# Patient Record
Sex: Female | Born: 1960 | Race: White | Hispanic: No | Marital: Married | Smoking: Former smoker
Health system: Southern US, Community
[De-identification: ages and names within clinical notes are randomized; demographics above are authoritative.]

## PROBLEM LIST (undated history)

## (undated) DIAGNOSIS — M1990 Unspecified osteoarthritis, unspecified site: Secondary | ICD-10-CM

## (undated) DIAGNOSIS — N809 Endometriosis, unspecified: Secondary | ICD-10-CM

## (undated) DIAGNOSIS — T7840XA Allergy, unspecified, initial encounter: Secondary | ICD-10-CM

## (undated) DIAGNOSIS — G43909 Migraine, unspecified, not intractable, without status migrainosus: Secondary | ICD-10-CM

## (undated) HISTORY — DX: Unspecified osteoarthritis, unspecified site: M19.90

## (undated) HISTORY — DX: Allergy, unspecified, initial encounter: T78.40XA

## (undated) HISTORY — PX: SHOULDER SURGERY: SHX246

## (undated) HISTORY — PX: ENDOMETRIAL ABLATION: SHX621

## (undated) HISTORY — DX: Migraine, unspecified, not intractable, without status migrainosus: G43.909

---

## 2019-08-01 DIAGNOSIS — Z01419 Encounter for gynecological examination (general) (routine) without abnormal findings: Secondary | ICD-10-CM | POA: Diagnosis not present

## 2019-09-01 DIAGNOSIS — R509 Fever, unspecified: Secondary | ICD-10-CM | POA: Diagnosis not present

## 2019-09-01 DIAGNOSIS — H938X9 Other specified disorders of ear, unspecified ear: Secondary | ICD-10-CM | POA: Diagnosis not present

## 2019-09-01 DIAGNOSIS — R0981 Nasal congestion: Secondary | ICD-10-CM | POA: Diagnosis not present

## 2019-12-31 DIAGNOSIS — R7303 Prediabetes: Secondary | ICD-10-CM | POA: Diagnosis not present

## 2019-12-31 DIAGNOSIS — R21 Rash and other nonspecific skin eruption: Secondary | ICD-10-CM | POA: Diagnosis not present

## 2019-12-31 DIAGNOSIS — E7800 Pure hypercholesterolemia, unspecified: Secondary | ICD-10-CM | POA: Diagnosis not present

## 2020-01-30 DIAGNOSIS — R519 Headache, unspecified: Secondary | ICD-10-CM | POA: Diagnosis not present

## 2020-01-30 DIAGNOSIS — S0081XA Abrasion of other part of head, initial encounter: Secondary | ICD-10-CM | POA: Diagnosis not present

## 2020-01-30 DIAGNOSIS — W009XXA Unspecified fall due to ice and snow, initial encounter: Secondary | ICD-10-CM | POA: Diagnosis not present

## 2020-01-30 DIAGNOSIS — S0083XA Contusion of other part of head, initial encounter: Secondary | ICD-10-CM | POA: Diagnosis not present

## 2020-01-31 ENCOUNTER — Encounter (HOSPITAL_COMMUNITY): Payer: Self-pay | Admitting: Emergency Medicine

## 2020-01-31 ENCOUNTER — Other Ambulatory Visit: Payer: Self-pay

## 2020-01-31 ENCOUNTER — Emergency Department (HOSPITAL_COMMUNITY)
Admission: EM | Admit: 2020-01-31 | Discharge: 2020-02-01 | Disposition: A | Discharge status: Home / Self Care | Payer: BC Managed Care – PPO | Attending: Emergency Medicine | Admitting: Emergency Medicine

## 2020-01-31 ENCOUNTER — Emergency Department (HOSPITAL_COMMUNITY): Payer: BC Managed Care – PPO

## 2020-01-31 DIAGNOSIS — R079 Chest pain, unspecified: Secondary | ICD-10-CM | POA: Diagnosis not present

## 2020-01-31 DIAGNOSIS — R0789 Other chest pain: Secondary | ICD-10-CM | POA: Diagnosis not present

## 2020-01-31 DIAGNOSIS — Z79899 Other long term (current) drug therapy: Secondary | ICD-10-CM | POA: Diagnosis not present

## 2020-01-31 HISTORY — DX: Endometriosis, unspecified: N80.9

## 2020-01-31 LAB — CBC
HCT: 42.2 % (ref 36.0–46.0)
Hemoglobin: 13.6 g/dL (ref 12.0–15.0)
MCH: 30.6 pg (ref 26.0–34.0)
MCHC: 32.2 g/dL (ref 30.0–36.0)
MCV: 94.8 fL (ref 80.0–100.0)
Platelets: 276 10*3/uL (ref 150–400)
RBC: 4.45 MIL/uL (ref 3.87–5.11)
RDW: 13.2 % (ref 11.5–15.5)
WBC: 9.6 10*3/uL (ref 4.0–10.5)
nRBC: 0 % (ref 0.0–0.2)

## 2020-01-31 LAB — BASIC METABOLIC PANEL
Anion gap: 10 (ref 5–15)
BUN: 17 mg/dL (ref 8–23)
CO2: 25 mmol/L (ref 22–32)
Calcium: 9.8 mg/dL (ref 8.9–10.3)
Chloride: 102 mmol/L (ref 98–111)
Creatinine, Ser: 0.79 mg/dL (ref 0.44–1.00)
GFR, Estimated: >60 mL/min (ref >60)
Glucose, Bld: 130 mg/dL — ABNORMAL HIGH (ref 70–99)
Potassium: 4.1 mmol/L (ref 3.5–5.1)
Sodium: 137 mmol/L (ref 135–145)

## 2020-01-31 LAB — TROPONIN I (HIGH SENSITIVITY): Troponin I (High Sensitivity): 4 ng/L (ref <18)

## 2020-01-31 NOTE — ED Triage Notes (Signed)
Patient reports left chest pain "pressure/heat" radiating to both arms and body this evening  , denies SOB , no emesis or diaphoresis .

## 2020-01-31 NOTE — ED Provider Notes (Signed)
[facility] Provider Note   CSN: [id number] Arrival date & time: 01/31/20  2133     History Chief Complaint  Patient presents with  . Chest Pain    [facility] is a 60 y.o. female.  Presents to the emergency department for evaluation of chest pain.  Patient reports that she had onset of a "pinging" sensation in the center of her chest earlier today.  The area skips around from left to right.  She has not identified anything that causes the symptom.  She does not have any nausea, diaphoresis or shortness of breath.  No history of heart disease.  She reports that she has felt some "wooziness", no syncope.  After she had onset of the discomfort in the chest she started to feel a "hot" sensation that traveled down her body.  This sensation has since resolved.  Patient reports that she has borderline high cholesterol, no hypertension, no diabetes, no smoking history.  Mother had congestive heart failure and heart attack later in life but no early heart disease.  Patient reports that she fell a couple of days ago shoveling her driveway.  She is not sure if this is related to the chest discomfort, cannot recall if she had the symptoms prior to the fall or not.     HPI: A 60 year old patient with a history of hypercholesterolemia and obesity presents for evaluation of chest pain. Initial onset of pain was more than 6 hours ago. The patient's chest pain is well-localized and is not worse with exertion. The patient's chest pain is middle- or left-sided, is not described as heaviness/pressure/tightness, is not sharp and does not radiate to the arms/jaw/neck. The patient does not complain of nausea and denies diaphoresis. The patient has no history of stroke, has no history of peripheral artery disease, has not smoked in the past 90 days, denies any history of treated diabetes, has no relevant family history of coronary artery disease (first degree relative at  less than age 37) and is not hypertensive.   Past Medical History:  Diagnosis Date  . Endometriosis     There are no problems to display for this patient.   History reviewed. No pertinent surgical history.   OB History   No obstetric history on file.     No family history on file.  Social History   Tobacco Use  . Smoking status: Never Smoker  . Smokeless tobacco: Never Used  Substance Use Topics  . Alcohol use: Never  . Drug use: Never    Home Medications Prior to Admission medications   Medication Sig Start Date End Date Taking? Authorizing Provider  acetaminophen (TYLENOL) 325 MG tablet Take 650 mg by mouth every 6 (six) hours as needed for moderate pain or headache.   Yes [provider]  Alpha-D-Galactosidase (ECK FOOD ENZYME PO) Take 1 capsule by mouth See admin instructions. 30 minutes before supper   Yes [provider]  atorvastatin (LIPITOR) 40 MG tablet Take 40 mg by mouth at bedtime. 01/30/20  Yes [provider]  EPINEPHrine 0.3 mg/0.3 mL IJ SOAJ injection Inject 0.3 mg into the muscle daily as needed.   Yes [provider]  estradiol (ESTRACE) 1 MG tablet Take 1 mg by mouth at bedtime. 11/29/19  Yes [provider]  fexofenadine (ALLEGRA) 60 MG tablet Take 60 mg by mouth daily. 03/21/18  Yes [provider]  glucosamine-chondroitin 500-400 MG tablet Take 1 tablet by mouth at bedtime.  Yes [provider]  Lactobacillus-Inulin (CULTURELLE DIGESTIVE DAILY PO) Take 1 capsule by mouth daily.   Yes [provider]  LUTEIN PO Take 1 tablet by mouth at bedtime.   Yes [provider]  Magnesium 200 MG TABS Take 400 mg by mouth at bedtime.   Yes [provider]  MEGARED OMEGA-3 KRILL OIL PO Take 1 capsule by mouth at bedtime.   Yes [provider]  Multiple Vitamins-Minerals (CENTRUM SILVER ADULT 50+ PO) Take 1 tablet by mouth daily.   Yes [provider]   progesterone (PROMETRIUM) 100 MG capsule Take 100 mg by mouth at bedtime. 11/29/19  Yes [provider]  TURMERIC PO Take 1 tablet by mouth at bedtime.   Yes [provider]    Allergies    Bee venom and Other  Review of Systems   Review of Systems  Cardiovascular: Positive for chest pain.  Neurological: Positive for dizziness.  All other systems reviewed and are negative.   Physical Exam Updated Vital Signs BP (!) 141/62   Pulse 68   Temp 98 F (36.7 C) (Oral)   Resp 19   Ht 5' (1.524 m)   Wt 78 kg   SpO2 100%   BMI 33.58 kg/m   Physical Exam Vitals and nursing note reviewed.  Constitutional:      General: She is not in acute distress.    Appearance: Normal appearance. She is well-developed and well-nourished.  HENT:     Head: Normocephalic and atraumatic.     Right Ear: Hearing normal.     Left Ear: Hearing normal.     Nose: Nose normal.     Mouth/Throat:     Mouth: Oropharynx is clear and moist and mucous membranes are normal.  Eyes:     Extraocular Movements: EOM normal.     Conjunctiva/sclera: Conjunctivae normal.     Pupils: Pupils are equal, round, and reactive to light.  Cardiovascular:     Rate and Rhythm: Regular rhythm.     Heart sounds: S1 normal and S2 normal. No murmur heard. No friction rub. No gallop.   Pulmonary:     Effort: Pulmonary effort is normal. No respiratory distress.     Breath sounds: Normal breath sounds.  Chest:     Chest wall: No tenderness.  Abdominal:     General: Bowel sounds are normal.     Palpations: Abdomen is soft. There is no hepatosplenomegaly.     Tenderness: There is no abdominal tenderness. There is no guarding or rebound. Negative signs include Murphy's sign and McBurney's sign.     Hernia: No hernia is present.  Musculoskeletal:        General: Normal range of motion.     Cervical back: Normal range of motion and neck supple.  Skin:    General: Skin is warm, dry and intact.     Findings: No  rash.     Nails: There is no cyanosis.  Neurological:     Mental Status: She is alert and oriented to person, place, and time.     GCS: GCS eye subscore is 4. GCS verbal subscore is 5. GCS motor subscore is 6.     Cranial Nerves: No cranial nerve deficit.     Sensory: No sensory deficit.     Coordination: Coordination normal.     Deep Tendon Reflexes: Strength normal.  Psychiatric:        Mood and Affect: Mood and affect normal.  Speech: Speech normal.        Behavior: Behavior normal.        Thought Content: Thought content normal.     ED Results / Procedures / Treatments   Labs (all labs ordered are listed, but only abnormal results are displayed) Labs Reviewed  BASIC METABOLIC PANEL - Abnormal; Notable for the following components:      Result Value   Glucose, Bld 130 (*)    All other components within normal limits  CBC  TROPONIN I (HIGH SENSITIVITY)  TROPONIN I (HIGH SENSITIVITY)    EKG EKG Interpretation  Date/Time:  Saturday January 31 2020 21:51:00 EST Ventricular Rate:  70 PR Interval:  134 QRS Duration: 86 QT Interval:  392 QTC Calculation: 423 R Axis:   30 Text Interpretation: Sinus rhythm with sinus arrhythmia with frequent Premature ventricular complexes Cannot rule out Anterior infarct , age undetermined Abnormal ECG No old tracing to compare Confirmed by Marily Memos [phone]) on 01/31/2020 11:12:58 PM   Radiology DG Chest 2 View  Result Date: 01/31/2020 CLINICAL DATA:  Chest pain EXAM: CHEST - 2 VIEW COMPARISON:  None. FINDINGS: The heart size and mediastinal contours are within normal limits. Both lungs are clear. The visualized skeletal structures are unremarkable. IMPRESSION: No active cardiopulmonary disease. Electronically Signed   By: Burman Nieves M.D.   On: 01/31/2020 22:29    Procedures Procedures (including critical care time)  Medications Ordered in ED Medications - No data to display  ED Course  I have reviewed the triage  vital signs and the nursing notes.  Pertinent labs & imaging results that were available during my care of the patient were reviewed by me and considered in my medical decision making (see chart for details).    MDM Rules/Calculators/A&P HEAR Score: 2                        Patient presents to the emergency department for chest discomfort.  Symptoms seem somewhat atypical.  She is experiencing intermittent paroxysms of pain in the center of her chest throughout the day.  Pain is sometimes to the right of the sternal border, sometimes to the left.  She has not identified any causal factors.  No associated shortness of breath, nausea, diaphoresis.  She has minimal cardiac risk factors, hear score is 2.  Work-up has been reassuring.  EKG with PVCs, no other acute findings.  Troponins negative.  No tachycardia, tachypnea, hypoxia or shortness of breath.  She did have a fall the other day and this might be related in someway, however she does not have any significant chest wall tenderness.  As patient is very low risk, heart pathway recommends outpatient follow-up.  She appears stable for discharge at this time.  She does not have a PCP, referred to cardiology for repeat evaluation.  Given return precautions.  Final Clinical Impression(s) / ED Diagnoses Final diagnoses:  Atypical chest pain    Rx / DC Orders ED Discharge Orders    None       Aaron Bostwick, Canary Brim, MD 02/01/20 [phone]

## 2020-02-01 LAB — TROPONIN I (HIGH SENSITIVITY): Troponin I (High Sensitivity): 4 ng/L (ref <18)

## 2020-06-09 DIAGNOSIS — S70369A Insect bite (nonvenomous), unspecified thigh, initial encounter: Secondary | ICD-10-CM | POA: Diagnosis not present

## 2020-06-09 DIAGNOSIS — W57XXXA Bitten or stung by nonvenomous insect and other nonvenomous arthropods, initial encounter: Secondary | ICD-10-CM | POA: Diagnosis not present

## 2020-06-24 DIAGNOSIS — J329 Chronic sinusitis, unspecified: Secondary | ICD-10-CM | POA: Diagnosis not present

## 2020-07-15 DIAGNOSIS — H2513 Age-related nuclear cataract, bilateral: Secondary | ICD-10-CM | POA: Diagnosis not present

## 2020-07-15 DIAGNOSIS — H353131 Nonexudative age-related macular degeneration, bilateral, early dry stage: Secondary | ICD-10-CM | POA: Diagnosis not present

## 2020-07-15 DIAGNOSIS — H5213 Myopia, bilateral: Secondary | ICD-10-CM | POA: Diagnosis not present

## 2020-10-12 DIAGNOSIS — Z8249 Family history of ischemic heart disease and other diseases of the circulatory system: Secondary | ICD-10-CM | POA: Diagnosis not present

## 2020-10-12 DIAGNOSIS — M25561 Pain in right knee: Secondary | ICD-10-CM | POA: Diagnosis not present

## 2020-10-12 DIAGNOSIS — Z23 Encounter for immunization: Secondary | ICD-10-CM | POA: Diagnosis not present

## 2020-10-12 DIAGNOSIS — E7800 Pure hypercholesterolemia, unspecified: Secondary | ICD-10-CM | POA: Diagnosis not present

## 2020-10-12 DIAGNOSIS — R7303 Prediabetes: Secondary | ICD-10-CM | POA: Diagnosis not present

## 2020-10-22 DIAGNOSIS — M25561 Pain in right knee: Secondary | ICD-10-CM | POA: Diagnosis not present

## 2020-10-22 DIAGNOSIS — H25043 Posterior subcapsular polar age-related cataract, bilateral: Secondary | ICD-10-CM | POA: Diagnosis not present

## 2020-10-22 DIAGNOSIS — G8929 Other chronic pain: Secondary | ICD-10-CM | POA: Diagnosis not present

## 2020-10-22 DIAGNOSIS — H2513 Age-related nuclear cataract, bilateral: Secondary | ICD-10-CM | POA: Diagnosis not present

## 2020-10-26 DIAGNOSIS — M25561 Pain in right knee: Secondary | ICD-10-CM | POA: Diagnosis not present

## 2020-10-26 DIAGNOSIS — G8929 Other chronic pain: Secondary | ICD-10-CM | POA: Diagnosis not present

## 2020-11-02 DIAGNOSIS — G8929 Other chronic pain: Secondary | ICD-10-CM | POA: Diagnosis not present

## 2020-11-02 DIAGNOSIS — M25561 Pain in right knee: Secondary | ICD-10-CM | POA: Diagnosis not present

## 2020-11-16 DIAGNOSIS — G8929 Other chronic pain: Secondary | ICD-10-CM | POA: Diagnosis not present

## 2020-11-16 DIAGNOSIS — M25561 Pain in right knee: Secondary | ICD-10-CM | POA: Diagnosis not present

## 2020-11-18 DIAGNOSIS — Z780 Asymptomatic menopausal state: Secondary | ICD-10-CM | POA: Diagnosis not present

## 2020-11-18 DIAGNOSIS — Z01419 Encounter for gynecological examination (general) (routine) without abnormal findings: Secondary | ICD-10-CM | POA: Diagnosis not present

## 2020-11-23 DIAGNOSIS — G8929 Other chronic pain: Secondary | ICD-10-CM | POA: Diagnosis not present

## 2020-11-23 DIAGNOSIS — M25561 Pain in right knee: Secondary | ICD-10-CM | POA: Diagnosis not present

## 2020-11-24 DIAGNOSIS — Z1231 Encounter for screening mammogram for malignant neoplasm of breast: Secondary | ICD-10-CM | POA: Diagnosis not present

## 2020-12-07 DIAGNOSIS — G8929 Other chronic pain: Secondary | ICD-10-CM | POA: Diagnosis not present

## 2020-12-07 DIAGNOSIS — M25561 Pain in right knee: Secondary | ICD-10-CM | POA: Diagnosis not present

## 2020-12-14 DIAGNOSIS — G8929 Other chronic pain: Secondary | ICD-10-CM | POA: Diagnosis not present

## 2020-12-14 DIAGNOSIS — M25561 Pain in right knee: Secondary | ICD-10-CM | POA: Diagnosis not present

## 2020-12-21 DIAGNOSIS — M25561 Pain in right knee: Secondary | ICD-10-CM | POA: Diagnosis not present

## 2020-12-21 DIAGNOSIS — G8929 Other chronic pain: Secondary | ICD-10-CM | POA: Diagnosis not present

## 2020-12-31 DIAGNOSIS — G8929 Other chronic pain: Secondary | ICD-10-CM | POA: Diagnosis not present

## 2020-12-31 DIAGNOSIS — M25561 Pain in right knee: Secondary | ICD-10-CM | POA: Diagnosis not present

## 2021-01-14 DIAGNOSIS — M25561 Pain in right knee: Secondary | ICD-10-CM | POA: Diagnosis not present

## 2021-01-14 DIAGNOSIS — G8929 Other chronic pain: Secondary | ICD-10-CM | POA: Diagnosis not present

## 2021-01-18 DIAGNOSIS — H25812 Combined forms of age-related cataract, left eye: Secondary | ICD-10-CM | POA: Diagnosis not present

## 2021-01-18 DIAGNOSIS — H2512 Age-related nuclear cataract, left eye: Secondary | ICD-10-CM | POA: Diagnosis not present

## 2021-01-18 DIAGNOSIS — H25042 Posterior subcapsular polar age-related cataract, left eye: Secondary | ICD-10-CM | POA: Diagnosis not present

## 2021-02-01 DIAGNOSIS — H25011 Cortical age-related cataract, right eye: Secondary | ICD-10-CM | POA: Diagnosis not present

## 2021-02-01 DIAGNOSIS — H25811 Combined forms of age-related cataract, right eye: Secondary | ICD-10-CM | POA: Diagnosis not present

## 2021-02-01 DIAGNOSIS — H2511 Age-related nuclear cataract, right eye: Secondary | ICD-10-CM | POA: Diagnosis not present

## 2021-02-11 DIAGNOSIS — G8929 Other chronic pain: Secondary | ICD-10-CM | POA: Diagnosis not present

## 2021-02-11 DIAGNOSIS — M25561 Pain in right knee: Secondary | ICD-10-CM | POA: Diagnosis not present

## 2021-02-15 DIAGNOSIS — M25561 Pain in right knee: Secondary | ICD-10-CM | POA: Diagnosis not present

## 2021-02-15 DIAGNOSIS — G8929 Other chronic pain: Secondary | ICD-10-CM | POA: Diagnosis not present

## 2021-02-22 DIAGNOSIS — M25561 Pain in right knee: Secondary | ICD-10-CM | POA: Diagnosis not present

## 2021-02-22 DIAGNOSIS — G8929 Other chronic pain: Secondary | ICD-10-CM | POA: Diagnosis not present

## 2021-03-09 DIAGNOSIS — M25561 Pain in right knee: Secondary | ICD-10-CM | POA: Diagnosis not present

## 2021-03-09 DIAGNOSIS — G8929 Other chronic pain: Secondary | ICD-10-CM | POA: Diagnosis not present

## 2021-03-15 DIAGNOSIS — M25561 Pain in right knee: Secondary | ICD-10-CM | POA: Diagnosis not present

## 2021-03-15 DIAGNOSIS — G8929 Other chronic pain: Secondary | ICD-10-CM | POA: Diagnosis not present

## 2021-04-01 DIAGNOSIS — M25561 Pain in right knee: Secondary | ICD-10-CM | POA: Diagnosis not present

## 2021-04-01 DIAGNOSIS — G8929 Other chronic pain: Secondary | ICD-10-CM | POA: Diagnosis not present

## 2021-04-05 DIAGNOSIS — G8929 Other chronic pain: Secondary | ICD-10-CM | POA: Diagnosis not present

## 2021-04-05 DIAGNOSIS — M25561 Pain in right knee: Secondary | ICD-10-CM | POA: Diagnosis not present

## 2021-04-08 DIAGNOSIS — M25561 Pain in right knee: Secondary | ICD-10-CM | POA: Diagnosis not present

## 2021-04-08 DIAGNOSIS — G8929 Other chronic pain: Secondary | ICD-10-CM | POA: Diagnosis not present

## 2021-04-12 DIAGNOSIS — G8929 Other chronic pain: Secondary | ICD-10-CM | POA: Diagnosis not present

## 2021-04-12 DIAGNOSIS — M25561 Pain in right knee: Secondary | ICD-10-CM | POA: Diagnosis not present

## 2021-04-19 DIAGNOSIS — G8929 Other chronic pain: Secondary | ICD-10-CM | POA: Diagnosis not present

## 2021-04-19 DIAGNOSIS — M25561 Pain in right knee: Secondary | ICD-10-CM | POA: Diagnosis not present

## 2021-04-26 DIAGNOSIS — G8929 Other chronic pain: Secondary | ICD-10-CM | POA: Diagnosis not present

## 2021-04-26 DIAGNOSIS — M25561 Pain in right knee: Secondary | ICD-10-CM | POA: Diagnosis not present

## 2021-05-03 DIAGNOSIS — G8929 Other chronic pain: Secondary | ICD-10-CM | POA: Diagnosis not present

## 2021-05-03 DIAGNOSIS — M25561 Pain in right knee: Secondary | ICD-10-CM | POA: Diagnosis not present

## 2021-05-13 ENCOUNTER — Ambulatory Visit: Payer: BC Managed Care – PPO | Admitting: Nurse Practitioner

## 2021-05-13 ENCOUNTER — Encounter: Payer: Self-pay | Admitting: Nurse Practitioner

## 2021-05-13 VITALS — BP 124/62 | HR 66 | Temp 97.3°F | Ht 61.0 in | Wt 173.0 lb

## 2021-05-13 DIAGNOSIS — Z131 Encounter for screening for diabetes mellitus: Secondary | ICD-10-CM

## 2021-05-13 DIAGNOSIS — Z136 Encounter for screening for cardiovascular disorders: Secondary | ICD-10-CM | POA: Diagnosis not present

## 2021-05-13 DIAGNOSIS — I10 Essential (primary) hypertension: Secondary | ICD-10-CM | POA: Diagnosis not present

## 2021-05-13 DIAGNOSIS — G8929 Other chronic pain: Secondary | ICD-10-CM

## 2021-05-13 DIAGNOSIS — R7303 Prediabetes: Secondary | ICD-10-CM

## 2021-05-13 DIAGNOSIS — Z79899 Other long term (current) drug therapy: Secondary | ICD-10-CM | POA: Diagnosis not present

## 2021-05-13 DIAGNOSIS — D6851 Activated protein C resistance: Secondary | ICD-10-CM

## 2021-05-13 DIAGNOSIS — E669 Obesity, unspecified: Secondary | ICD-10-CM

## 2021-05-13 DIAGNOSIS — Z0000 Encounter for general adult medical examination without abnormal findings: Secondary | ICD-10-CM

## 2021-05-13 DIAGNOSIS — Z13220 Encounter for screening for lipoid disorders: Secondary | ICD-10-CM | POA: Diagnosis not present

## 2021-05-13 DIAGNOSIS — I209 Angina pectoris, unspecified: Secondary | ICD-10-CM

## 2021-05-13 DIAGNOSIS — J342 Deviated nasal septum: Secondary | ICD-10-CM

## 2021-05-13 DIAGNOSIS — J302 Other seasonal allergic rhinitis: Secondary | ICD-10-CM

## 2021-05-13 DIAGNOSIS — E559 Vitamin D deficiency, unspecified: Secondary | ICD-10-CM | POA: Diagnosis not present

## 2021-05-13 DIAGNOSIS — R42 Dizziness and giddiness: Secondary | ICD-10-CM

## 2021-05-13 DIAGNOSIS — R110 Nausea: Secondary | ICD-10-CM

## 2021-05-13 DIAGNOSIS — Z1389 Encounter for screening for other disorder: Secondary | ICD-10-CM | POA: Diagnosis not present

## 2021-05-13 DIAGNOSIS — Z0001 Encounter for general adult medical examination with abnormal findings: Secondary | ICD-10-CM

## 2021-05-13 DIAGNOSIS — Z1321 Encounter for screening for nutritional disorder: Secondary | ICD-10-CM

## 2021-05-13 DIAGNOSIS — R7309 Other abnormal glucose: Secondary | ICD-10-CM

## 2021-05-13 DIAGNOSIS — E7800 Pure hypercholesterolemia, unspecified: Secondary | ICD-10-CM
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
## 2021-05-13 NOTE — Patient Instructions (Signed)
Acute Knee Pain, Adult Many things can cause knee pain. Sometimes, knee pain is sudden (acute) and may be caused by damage, swelling, or irritation of the muscles and tissues that support your knee. The pain often goes away on its own with time and rest. If the pain does not go away, tests may be done to find out what is causing the pain. Follow these instructions at home: If you have a knee sleeve or brace:  Wear the knee sleeve or brace as told by your doctor. Take it off only as told by your doctor. Loosen it if your toes: Tingle. Become numb. Turn cold and blue. Keep it clean. If the knee sleeve or brace is not waterproof: Do not let it get wet. Cover it with a watertight covering when you take a bath or shower. Activity Rest your knee. Do not do things that cause pain or make pain worse. Avoid activities where both feet leave the ground at the same time (high-impact activities). Examples are running, jumping rope, and doing jumping jacks. Work with a physical therapist to make a safe exercise program, as told by your doctor. Managing pain, stiffness, and swelling  If told, put ice on the knee. To do this: If you have a removable knee sleeve or brace, take it off as told by your doctor. Put ice in a plastic bag. Place a towel between your skin and the bag. Leave the ice on for 20 minutes, 2-3 times a day. Take off the ice if your skin turns bright red. This is very important. If you cannot feel pain, heat, or cold, you have a greater risk of damage to the area. If told, use an elastic bandage to put pressure (compression) on your injured knee. Raise your knee above the level of your heart while you are sitting or lying down. Sleep with a pillow under your knee. General instructions Take over-the-counter and prescription medicines only as told by your doctor. Do not smoke or use any products that contain nicotine or tobacco. If you need help quitting, ask your doctor. If you are  overweight, work with your doctor and a food expert (dietitian) to set goals to lose weight. Being overweight can make your knee hurt more. Watch for any changes in your symptoms. Keep all follow-up visits. Contact a doctor if: The knee pain does not stop. The knee pain changes or gets worse. You have a fever along with knee pain. Your knee is red or feels warm when you touch it. Your knee gives out or locks up. Get help right away if: Your knee swells, and the swelling gets worse. You cannot move your knee. You have very bad knee pain that does not get better with pain medicine. Summary Many things can cause knee pain. The pain often goes away on its own with time and rest. Your doctor may do tests to find out the cause of the pain. Watch for any changes in your symptoms. Relieve your pain with rest, medicines, light activity, and use of ice. Get help right away if you cannot move your knee or your knee pain is very bad. This information is not intended to replace advice given to you by your health care provider. Make sure you discuss any questions you have with your health care provider. Document Revised: 06/11/2019 Document Reviewed: 06/11/2019 Elsevier Patient Education  2023 [employer]  

## 2021-05-14 LAB — CBC WITH DIFFERENTIAL/PLATELET
Absolute Monocytes: 338 cells/uL (ref 200–950)
Basophils Absolute: 59 cells/uL (ref 0–200)
Basophils Relative: 1.2 %
Eosinophils Absolute: 59 cells/uL (ref 15–500)
Eosinophils Relative: 1.2 %
HCT: 40.7 % (ref 35.0–45.0)
Hemoglobin: 13.3 g/dL (ref 11.7–15.5)
Lymphs Abs: 1681 cells/uL (ref 850–3900)
MCH: 30.9 pg (ref 27.0–33.0)
MCHC: 32.7 g/dL (ref 32.0–36.0)
MCV: 94.4 fL (ref 80.0–100.0)
MPV: 11 fL (ref 7.5–12.5)
Monocytes Relative: 6.9 %
Neutro Abs: 2764 cells/uL (ref 1500–7800)
Neutrophils Relative %: 56.4 %
Platelets: 256 10*3/uL (ref 140–400)
RBC: 4.31 10*6/uL (ref 3.80–5.10)
RDW: 12.7 % (ref 11.0–15.0)
Total Lymphocyte: 34.3 %
WBC: 4.9 10*3/uL (ref 3.8–10.8)

## 2021-05-14 LAB — MAGNESIUM: Magnesium: 2.2 mg/dL (ref 1.5–2.5)

## 2021-05-14 LAB — HEMOGLOBIN A1C
Hgb A1c MFr Bld: 5.8 % of total Hgb — ABNORMAL HIGH (ref <5.7)
Mean Plasma Glucose: 120 mg/dL
eAG (mmol/L): 6.6 mmol/L

## 2021-05-14 LAB — COMPLETE METABOLIC PANEL WITH GFR
AG Ratio: 1.5 (calc) (ref 1.0–2.5)
ALT: 32 U/L — ABNORMAL HIGH (ref 6–29)
AST: 28 U/L (ref 10–35)
Albumin: 4.3 g/dL (ref 3.6–5.1)
Alkaline phosphatase (APISO): 90 U/L (ref 37–153)
BUN: 15 mg/dL (ref 7–25)
CO2: 28 mmol/L (ref 20–32)
Calcium: 9.7 mg/dL (ref 8.6–10.4)
Chloride: 102 mmol/L (ref 98–110)
Creat: 0.62 mg/dL (ref 0.50–1.05)
Globulin: 2.8 g/dL (calc) (ref 1.9–3.7)
Glucose, Bld: 94 mg/dL (ref 65–99)
Potassium: 4.4 mmol/L (ref 3.5–5.3)
Sodium: 139 mmol/L (ref 135–146)
Total Bilirubin: 0.8 mg/dL (ref 0.2–1.2)
Total Protein: 7.1 g/dL (ref 6.1–8.1)
eGFR: 100 mL/min/{1.73_m2} (ref >=60)

## 2021-05-14 LAB — URINALYSIS, ROUTINE W REFLEX MICROSCOPIC
Bilirubin Urine: NEGATIVE
Glucose, UA: NEGATIVE
Hgb urine dipstick: NEGATIVE
Ketones, ur: NEGATIVE
Leukocytes,Ua: NEGATIVE
Nitrite: NEGATIVE
Protein, ur: NEGATIVE
Specific Gravity, Urine: 1.009 (ref 1.001–1.035)
pH: 7.5 (ref 5.0–8.0)

## 2021-05-14 LAB — TSH: TSH: 3.31 mIU/L (ref 0.40–4.50)

## 2021-05-14 LAB — LIPID PANEL
Cholesterol: 192 mg/dL (ref <200)
HDL: 68 mg/dL (ref >=50)
LDL Cholesterol (Calc): 99 mg/dL (calc)
Non-HDL Cholesterol (Calc): 124 mg/dL (calc) (ref <130)
Total CHOL/HDL Ratio: 2.8 (calc) (ref <5.0)
Triglycerides: 153 mg/dL — ABNORMAL HIGH (ref <150)

## 2021-05-14 LAB — VITAMIN D 25 HYDROXY (VIT D DEFICIENCY, FRACTURES): Vit D, 25-Hydroxy: 41 ng/mL (ref 30–100)

## 2021-05-17 DIAGNOSIS — G8929 Other chronic pain: Secondary | ICD-10-CM | POA: Diagnosis not present

## 2021-05-17 DIAGNOSIS — M25561 Pain in right knee: Secondary | ICD-10-CM | POA: Diagnosis not present

## 2021-05-20 ENCOUNTER — Encounter: Payer: Self-pay | Admitting: Nurse Practitioner

## 2021-05-20 DIAGNOSIS — R42 Dizziness and giddiness: Secondary | ICD-10-CM | POA: Insufficient documentation

## 2021-05-20 DIAGNOSIS — R110 Nausea: Secondary | ICD-10-CM | POA: Insufficient documentation

## 2021-05-20 DIAGNOSIS — I209 Angina pectoris, unspecified: Secondary | ICD-10-CM | POA: Insufficient documentation

## 2021-05-24 DIAGNOSIS — G8929 Other chronic pain: Secondary | ICD-10-CM | POA: Diagnosis not present

## 2021-05-24 DIAGNOSIS — M25561 Pain in right knee: Secondary | ICD-10-CM | POA: Diagnosis not present

## 2021-05-31 ENCOUNTER — Other Ambulatory Visit: Payer: Self-pay | Admitting: Nurse Practitioner

## 2021-05-31 DIAGNOSIS — G8929 Other chronic pain: Secondary | ICD-10-CM

## 2021-06-07 ENCOUNTER — Encounter: Payer: Self-pay | Admitting: Nurse Practitioner

## 2021-06-07 ENCOUNTER — Ambulatory Visit: Payer: BC Managed Care – PPO | Admitting: Nurse Practitioner

## 2021-06-07 ENCOUNTER — Other Ambulatory Visit: Payer: BC Managed Care – PPO

## 2021-06-07 VITALS — BP 150/80 | HR 61 | Temp 96.8°F | Wt 171.0 lb

## 2021-06-07 DIAGNOSIS — L299 Pruritus, unspecified: Secondary | ICD-10-CM | POA: Diagnosis not present

## 2021-06-07 DIAGNOSIS — L239 Allergic contact dermatitis, unspecified cause: Secondary | ICD-10-CM | POA: Diagnosis not present

## 2021-06-07 DIAGNOSIS — G8929 Other chronic pain: Secondary | ICD-10-CM | POA: Diagnosis not present

## 2021-06-07 DIAGNOSIS — R21 Rash and other nonspecific skin eruption: Secondary | ICD-10-CM | POA: Diagnosis not present

## 2021-06-07 DIAGNOSIS — M25561 Pain in right knee: Secondary | ICD-10-CM | POA: Diagnosis not present

## 2021-06-07 MED ORDER — DEXAMETHASONE SODIUM PHOSPHATE 10 MG/ML IJ SOLN
10.0000 mg | Freq: Once | INTRAMUSCULAR | Status: AC
  Administered 2021-06-07: 10 mg via INTRAMUSCULAR

## 2021-06-07 MED ORDER — PREDNISONE 20 MG PO TABS: ORAL_TABLET | ORAL | 0 refills | Status: DC

## 2021-06-07 NOTE — Progress Notes (Signed)
Assessment and Plan:  Vanessa Hartman was seen today for an episodic visit.  1. Allergic contact dermatitis, unspecified trigger Continue Zyrtec. Apply Benadryl topical cream.  - dexamethasone (DECADRON) injection 10 mg - predniSONE (DELTASONE) 20 MG tablet; Take 2 tabs (40 mg) for 3 days followed by 1 tab (20 mg) for 4 days.  Dispense: 10 tablet; Refill: 0  2. Rash and nonspecific skin eruption  - predniSONE (DELTASONE) 20 MG tablet; Take 2 tabs (40 mg) for 3 days followed by 1 tab (20 mg) for 4 days.  Dispense: 10 tablet; Refill: 0  3. Pruritus Refrain from scratching to decrease spreading of rash and risk for infection,  - predniSONE (DELTASONE) 20 MG tablet; Take 2 tabs (40 mg) for 3 days followed by 1 tab (20 mg) for 4 days.  Dispense: 10 tablet; Refill: 0  Discussed possible review of allergy testing for identification of trigger.  Notify office for further evaluation and treatment, questions or concerns if s/s fail to improve. The risks and benefits of my recommendations, as well as other treatment options were discussed with the patient today. Questions were answered.  Further disposition pending results of labs. Discussed med's effects and SE's.    Over 15 minutes of exam, counseling, chart review, and critical decision making was performed.   Future Appointments  Date Time Provider Department Center  08/19/2021 10:00 AM Lizbeth Feijoo, Archie Patten, NP [facility] None    ------------------------------------------------------------------------------------------------------------------   HPI BP (!) 150/80   Pulse 61   Temp (!) 96.8 F (36 C)   Wt 171 lb (77.6 kg)   SpO2 99%   BMI 32.31 kg/m    Vanessa Hartman is a 61 y.o. female who presents for evaluation of a rash involving the forearm, trunk, and neck . Rash started 1 week ago. Lesions are dark and pink, and raised in texture. Rash has not changed over time. Rash is pruritic. Associated symptoms: none. Patient denies:  congestion, cough, fever, headache, nausea, sore throat, and vomiting. Patient has had contacts with similar rash. Patient has had new exposures plants, trees.  She has used topical Hydrocortisone topical cream without relief.   Past Medical History:  Diagnosis Date   Allergy    Arthritis    Endometriosis    Migraine      Allergies  Allergen Reactions   Bee Venom Anaphylaxis   Other Nausea And Vomiting    Pt states she has "sever sensitivity" to "all opioids"    Current Outpatient Medications on File Prior to Visit  Medication Sig   acetaminophen (TYLENOL) 325 MG tablet Take 650 mg by mouth every 6 (six) hours as needed for moderate pain or headache.   atorvastatin (LIPITOR) 40 MG tablet Take 40 mg by mouth at bedtime.   cetirizine (ZYRTEC) 10 MG tablet Take 10 mg by mouth daily.   Coenzyme Q10 (COQ10 PO) Take by mouth.   Cyanocobalamin (B-12) 500 MCG TABS Take by mouth.   EPINEPHrine 0.3 mg/0.3 mL IJ SOAJ injection Inject 0.3 mg into the muscle daily as needed.   estradiol (ESTRACE) 1 MG tablet Take 1 mg by mouth at bedtime.   glucosamine-chondroitin 500-400 MG tablet Take 1 tablet by mouth at bedtime.   Lactobacillus-Inulin (CULTURELLE DIGESTIVE DAILY PO) Take 1 capsule by mouth daily.   LUTEIN PO Take 1 tablet by mouth at bedtime.   Magnesium 200 MG TABS Take 400 mg by mouth at bedtime.   MEGARED OMEGA-3 KRILL OIL PO Take 1 capsule by mouth at bedtime.  Multiple Vitamins-Minerals (CENTRUM SILVER ADULT 50+ PO) Take 1 tablet by mouth daily.   progesterone (PROMETRIUM) 100 MG capsule Take 100 mg by mouth at bedtime.   TURMERIC PO Take 1 tablet by mouth at bedtime.   vitamin C (ASCORBIC ACID) 500 MG tablet Take 500 mg by mouth daily.   VITAMIN D PO Take by mouth. 4,000 units   zinc gluconate 50 MG tablet Take 50 mg by mouth daily.   Alpha-D-Galactosidase (ECK FOOD ENZYME PO) Take 1 capsule by mouth See admin instructions. 30 minutes before supper (Patient not taking: Reported on  06/07/2021)   fexofenadine (ALLEGRA) 60 MG tablet Take 60 mg by mouth daily. (Patient not taking: Reported on 05/13/2021)   No current facility-administered medications on file prior to visit.    ROS: all negative except what is noted in the HPI.   Physical Exam:  BP (!) 150/80   Pulse 61   Temp (!) 96.8 F (36 C)   Wt 171 lb (77.6 kg)   SpO2 99%   BMI 32.31 kg/m   General Appearance: NAD.  Awake, conversant and cooperative. Eyes: PERRLA, EOMs intact.  Sclera white.  Conjunctiva without erythema. Sinuses: No frontal/maxillary tenderness.  No nasal discharge. Nares patent.  ENT/Mouth: Ext aud canals clear.  Bilateral TMs w/DOL and without erythema or bulging. Hearing intact.  Posterior pharynx without swelling or exudate.  Tonsils without swelling or erythema.  Neck: Supple.  No masses, nodules or thyromegaly. Respiratory: Effort is regular with non-labored breathing. Breath sounds are equal bilaterally without rales, rhonchi, wheezing or stridor.  Cardio: RRR with no MRGs. Brisk peripheral pulses without edema.  Abdomen: Active BS in all four quadrants.  Soft and non-tender without guarding, rebound tenderness, hernias or masses. Lymphatics: Non tender without lymphadenopathy.  Musculoskeletal: Full ROM, 5/5 strength, normal ambulation.  No clubbing or cyanosis. Skin: Scattered areas of linear raised erythematous rash along BUE forearms, neck and right lower trunk area.  Excoriation.   Neuro: CN II-XII grossly normal. Normal muscle tone without cerebellar symptoms and intact sensation.   Psych: AO X 3,  appropriate mood and affect, insight and judgment.     Vanessa Glimpse, NP 2:18 PM [facility]

## 2021-06-07 NOTE — Patient Instructions (Signed)
Contact Dermatitis Dermatitis is redness, soreness, and swelling (inflammation) of the skin. Contact dermatitis is a reaction to something that touches the skin. There are two types of contact dermatitis: Irritant contact dermatitis. This happens when something bothers (irritates) your skin, like soap. Allergic contact dermatitis. This is caused when you are exposed to something that you are allergic to, such as poison ivy. What are the causes? Common causes of irritant contact dermatitis include: Makeup. Soaps. Detergents. Bleaches. Acids. Metals, such as nickel. Common causes of allergic contact dermatitis include: Plants. Chemicals. Jewelry. Latex. Medicines. Preservatives in products, such as clothing. What increases the risk? Having a job that exposes you to things that bother your skin. Having asthma or eczema. What are the signs or symptoms? Symptoms may happen anywhere the irritant has touched your skin. Symptoms include: Dry or flaky skin. Redness. Cracks. Itching. Pain or a burning feeling. Blisters. Blood or clear fluid draining from skin cracks. With allergic contact dermatitis, swelling may occur. This may happen in places such as the eyelids, mouth, or genitals. How is this treated? This condition is treated by checking for the cause of the reaction and protecting your skin. Treatment may also include: Steroid creams, ointments, or medicines. Antibiotic medicines or other ointments, if you have a skin infection. Lotion or medicines to help with itching. A bandage (dressing). Follow these instructions at home: Skin care Moisturize your skin as needed. Put cool cloths on your skin. Put a baking soda paste on your skin. Stir water into baking soda until it looks like a paste. Do not scratch your skin. Avoid having things rub up against your skin. Avoid the use of soaps, perfumes, and dyes. Medicines Take or apply over-the-counter and prescription medicines  only as told by your doctor. If you were prescribed an antibiotic medicine, take or apply it as told by your doctor. Do not stop using it even if your condition starts to get better. Bathing Take a bath with: Epsom salts. Baking soda. Colloidal oatmeal. Bathe less often. Bathe in warm water. Avoid using hot water. Bandage care If you were given a bandage, change it as told by your doctor. Wash your hands with soap and water before and after you change your bandage. If soap and water are not available, use hand sanitizer. General instructions Avoid the things that caused your reaction. If you do not know what caused it, keep a journal. Write down: What you eat. What skin products you use. What you drink. What you wear in the area that has symptoms. This includes jewelry. Check the affected areas every day for signs of infection. Check for: More redness, swelling, or pain. More fluid or blood. Warmth. Pus or a bad smell. Keep all follow-up visits as told by your doctor. This is important. Contact a doctor if: You do not get better with treatment. Your condition gets worse. You have signs of infection, such as: More swelling. Tenderness. More redness. Soreness. Warmth. You have a fever. You have new symptoms. Get help right away if: You have a very bad headache. You have neck pain. Your neck is stiff. You throw up (vomit). You feel very sleepy. You see red streaks coming from the area. Your bone or joint near the area hurts after the skin has healed. The area turns darker. You have trouble breathing. Summary Dermatitis is redness, soreness, and swelling of the skin. Symptoms may occur where the irritant has touched you. Treatment may include medicines and skin care. If you do not   know what caused your reaction, keep a journal. Contact a doctor if your condition gets worse or you have signs of infection. This information is not intended to replace advice given to you by  your health care provider. Make sure you discuss any questions you have with your health care provider. Document Revised: 10/11/2020 Document Reviewed: 10/11/2020 Elsevier Patient Education  2023 [employer]  

## 2021-06-21 DIAGNOSIS — M25561 Pain in right knee: Secondary | ICD-10-CM | POA: Diagnosis not present

## 2021-06-21 DIAGNOSIS — G8929 Other chronic pain: Secondary | ICD-10-CM | POA: Diagnosis not present

## 2021-07-15 DIAGNOSIS — M25561 Pain in right knee: Secondary | ICD-10-CM | POA: Diagnosis not present

## 2021-07-15 DIAGNOSIS — G8929 Other chronic pain: Secondary | ICD-10-CM | POA: Diagnosis not present

## 2021-08-19 ENCOUNTER — Ambulatory Visit: Payer: BC Managed Care – PPO | Admitting: Nurse Practitioner

## 2021-08-25 DIAGNOSIS — R9389 Abnormal findings on diagnostic imaging of other specified body structures: Secondary | ICD-10-CM | POA: Diagnosis not present

## 2021-12-12 DIAGNOSIS — N840 Polyp of corpus uteri: Secondary | ICD-10-CM | POA: Diagnosis not present

## 2021-12-12 DIAGNOSIS — N939 Abnormal uterine and vaginal bleeding, unspecified: Secondary | ICD-10-CM | POA: Diagnosis not present

## 2021-12-12 DIAGNOSIS — N950 Postmenopausal bleeding: Secondary | ICD-10-CM | POA: Diagnosis not present

## 2022-01-19 DIAGNOSIS — Z6832 Body mass index (BMI) 32.0-32.9, adult: Secondary | ICD-10-CM | POA: Diagnosis not present

## 2022-01-19 DIAGNOSIS — E669 Obesity, unspecified: Secondary | ICD-10-CM | POA: Diagnosis not present

## 2022-01-19 DIAGNOSIS — N840 Polyp of corpus uteri: Secondary | ICD-10-CM | POA: Diagnosis not present

## 2022-01-19 DIAGNOSIS — N950 Postmenopausal bleeding: Secondary | ICD-10-CM | POA: Diagnosis not present

## 2022-02-09 DIAGNOSIS — Z09 Encounter for follow-up examination after completed treatment for conditions other than malignant neoplasm: Secondary | ICD-10-CM | POA: Diagnosis not present

## 2022-02-15 ENCOUNTER — Encounter: Payer: Self-pay | Admitting: Nurse Practitioner

## 2022-02-15 ENCOUNTER — Ambulatory Visit (INDEPENDENT_AMBULATORY_CARE_PROVIDER_SITE_OTHER): Payer: BC Managed Care – PPO | Admitting: Nurse Practitioner

## 2022-02-15 ENCOUNTER — Other Ambulatory Visit: Payer: Self-pay

## 2022-02-15 VITALS — HR 80 | Temp 97.7°F | Ht 61.0 in | Wt 172.6 lb

## 2022-02-15 DIAGNOSIS — Z1152 Encounter for screening for COVID-19: Secondary | ICD-10-CM

## 2022-02-15 DIAGNOSIS — J020 Streptococcal pharyngitis: Secondary | ICD-10-CM

## 2022-02-15 DIAGNOSIS — R6889 Other general symptoms and signs: Secondary | ICD-10-CM | POA: Diagnosis not present

## 2022-02-15 DIAGNOSIS — J029 Acute pharyngitis, unspecified: Secondary | ICD-10-CM | POA: Diagnosis not present

## 2022-02-15 LAB — POCT INFLUENZA A/B
Influenza A, POC: NEGATIVE
Influenza B, POC: NEGATIVE

## 2022-02-15 LAB — POCT RAPID STREP A (OFFICE): Rapid Strep A Screen: NEGATIVE

## 2022-02-15 LAB — POC COVID19 BINAXNOW: SARS Coronavirus 2 Ag: NEGATIVE

## 2022-02-15 MED ORDER — AZITHROMYCIN 250 MG PO TABS: ORAL_TABLET | ORAL | 0 refills | Status: DC

## 2022-02-15 MED ORDER — ATORVASTATIN CALCIUM 40 MG PO TABS: 40.0000 mg | ORAL_TABLET | Freq: Every day | ORAL | 2 refills | Status: DC

## 2022-02-15 NOTE — Progress Notes (Signed)
Assessment and Plan:  Vanessa Hartman was seen today for an episodic visit.  Diagnoses and all order for this visit:  Flu-like symptoms Negative - POCT Influenza A/B  Encounter for screening for COVID-19 Negative - POC COVID-19  Acute streptococcal pharyngitis Negative - POCT rapid strep A  Sore throat [J02.9] Warm salt water gargles several times throughout the day. Start Azithromycin as directed Continue Coricidin could and cough as needed  Orders Placed This Encounter  Procedures   POC COVID-19    Order Specific Question:   Previously tested for COVID-19    Answer:   No    Order Specific Question:   Resident in a congregate (group) care setting    Answer:   No    Order Specific Question:   Employed in healthcare setting    Answer:   No    Order Specific Question:   Pregnant    Answer:   No   POCT Influenza A/B   POCT rapid strep A   Meds ordered this encounter  Medications   atorvastatin (LIPITOR) 40 MG tablet    Sig: Take 1 tablet (40 mg total) by mouth at bedtime.    Dispense:  90 tablet    Refill:  2   azithromycin (ZITHROMAX) 250 MG tablet    Sig: Take 2 tablets PO on day 1, then 1 tablet PO Q24H x 4 days    Dispense:  6 tablet    Refill:  0    Order Specific Question:   Supervising Provider    Answer:   Unk Pinto [phone]   Notify office for further evaluation and treatment, questions or concerns if s/s fail to improve. The risks and benefits of my recommendations, as well as other treatment options were discussed with the patient today. Questions were answered.  Further disposition pending results of labs. Discussed med's effects and SE's.    Over 15 minutes of exam, counseling, chart review, and critical decision making was performed.   No future appointments.  ------------------------------------------------------------------------------------------------------------------   HPI Pulse 80   Temp 97.7 F (36.5 C)   Ht 5\' 1"  (1.549 m)    Wt 172 lb 9.6 oz (78.3 kg)   SpO2 99%   BMI 32.61 kg/m   Patient complains of symptoms of a URI, possible sinusitis. Symptoms include right ear pressure/pain, congestion, facial pain, headache described as ache, nasal congestion, sinus pressure, and sore throat. Onset of symptoms was 7 days ago, and has been unchanged since that time. Treatment to date: antihistamines, decongestants, and nasal steroids.  Her granddaughter who she keeps was recently diagnosed with strep.  Denies fever, chills, N/V.   Past Medical History:  Diagnosis Date   Allergy    Arthritis    Endometriosis    Migraine      Allergies  Allergen Reactions   Bee Venom Anaphylaxis   Other Nausea And Vomiting    Pt states she has "sever sensitivity" to "all opioids"    Current Outpatient Medications on File Prior to Visit  Medication Sig   cetirizine (ZYRTEC) 10 MG tablet Take 10 mg by mouth daily.   Chlorpheniramine-Acetaminophen (CORICIDIN HBP COLD/FLU PO) Take by mouth.   Coenzyme Q10 (COQ10 PO) Take by mouth.   Cyanocobalamin (B-12) 500 MCG TABS Take by mouth.   EPINEPHrine 0.3 mg/0.3 mL IJ SOAJ injection Inject 0.3 mg into the muscle daily as needed.   estradiol (VIVELLE-DOT) 0.05 MG/24HR patch Place 1 patch onto the skin 2 (two) times a week.  glucosamine-chondroitin 500-400 MG tablet Take 1 tablet by mouth at bedtime.   Homeopathic Products (ZICAM COLD REMEDY PO) Take by mouth.   LUTEIN PO Take 1 tablet by mouth at bedtime.   Magnesium 200 MG TABS Take 400 mg by mouth at bedtime.   MEGARED OMEGA-3 KRILL OIL PO Take 1 capsule by mouth at bedtime.   Multiple Vitamins-Minerals (CENTRUM SILVER ADULT 50+ PO) Take 1 tablet by mouth daily.   progesterone (PROMETRIUM) 100 MG capsule Take 100 mg by mouth at bedtime.   TURMERIC PO Take 1 tablet by mouth at bedtime.   vitamin C (ASCORBIC ACID) 500 MG tablet Take 500 mg by mouth daily.   VITAMIN D PO Take by mouth. 4,000 units   zinc gluconate 50 MG tablet Take 50 mg  by mouth daily.   acetaminophen (TYLENOL) 325 MG tablet Take 650 mg by mouth every 6 (six) hours as needed for moderate pain or headache. (Patient not taking: Reported on 02/15/2022)   Alpha-D-Galactosidase (ECK FOOD ENZYME PO) Take 1 capsule by mouth See admin instructions. 30 minutes before supper (Patient not taking: Reported on 06/07/2021)   estradiol (ESTRACE) 1 MG tablet Take 1 mg by mouth at bedtime. (Patient not taking: Reported on 02/15/2022)   fexofenadine (ALLEGRA) 60 MG tablet Take 60 mg by mouth daily. (Patient not taking: Reported on 05/13/2021)   Lactobacillus-Inulin (CULTURELLE DIGESTIVE DAILY PO) Take 1 capsule by mouth daily. (Patient not taking: Reported on 02/15/2022)   predniSONE (DELTASONE) 20 MG tablet Take 2 tabs (40 mg) for 3 days followed by 1 tab (20 mg) for 4 days. (Patient not taking: Reported on 02/15/2022)   No current facility-administered medications on file prior to visit.    ROS: all negative except what is noted in the HPI.   Physical Exam:  Pulse 80   Temp 97.7 F (36.5 C)   Ht 5\' 1"  (1.549 m)   Wt 172 lb 9.6 oz (78.3 kg)   SpO2 99%   BMI 32.61 kg/m   General Appearance: NAD.  Awake, conversant and cooperative. Eyes: PERRLA, EOMs intact.  Sclera white.  Conjunctiva without erythema. Sinuses: Frontal/maxillary tenderness.  No nasal discharge. Nares patent.  ENT/Mouth: Ext aud canals clear.  Bilateral TMs w/DOL and without erythema or bulging.  Right TM with mild effusion. Hearing intact.  Posterior pharynx without swelling or exudate.  Tonsils with mild  erythema.  Neck: Supple.  No masses, nodules or thyromegaly. Respiratory: Effort is regular with non-labored breathing. Breath sounds are equal bilaterally without rales, rhonchi, wheezing or stridor.  Cardio: RRR with no MRGs. Brisk peripheral pulses without edema.  Abdomen: Active BS in all four quadrants.  Soft and non-tender without guarding, rebound tenderness, hernias or masses. Lymphatics: Non tender  without lymphadenopathy.  Musculoskeletal: Full ROM, 5/5 strength, normal ambulation.  No clubbing or cyanosis. Skin: Appropriate color for ethnicity. Warm without rashes, lesions, ecchymosis, ulcers.  Neuro: CN II-XII grossly normal. Normal muscle tone without cerebellar symptoms and intact sensation.   Psych: AO X 3,  appropriate mood and affect, insight and judgment.     Darrol Jump, NP 6:04 PM [facility]

## 2022-02-15 NOTE — Patient Instructions (Signed)

## 2022-02-16 ENCOUNTER — Encounter: Payer: Self-pay | Admitting: Nurse Practitioner

## 2022-02-19 ENCOUNTER — Other Ambulatory Visit: Payer: Self-pay | Admitting: Nurse Practitioner

## 2022-02-19 MED ORDER — BENZONATATE 100 MG PO CAPS: 100.0000 mg | ORAL_CAPSULE | Freq: Three times a day (TID) | ORAL | 1 refills | Status: AC | PRN

## 2022-02-23 ENCOUNTER — Other Ambulatory Visit: Payer: Self-pay | Admitting: Nurse Practitioner

## 2022-02-23 DIAGNOSIS — J069 Acute upper respiratory infection, unspecified: Secondary | ICD-10-CM

## 2022-02-23 DIAGNOSIS — R051 Acute cough: Secondary | ICD-10-CM

## 2022-02-23 DIAGNOSIS — R0602 Shortness of breath: Secondary | ICD-10-CM

## 2022-02-24 ENCOUNTER — Ambulatory Visit
Admission: RE | Admit: 2022-02-24 | Discharge: 2022-02-24 | Disposition: A | Discharge status: Home / Self Care | Payer: BC Managed Care – PPO | Source: Ambulatory Visit | Attending: Nurse Practitioner | Admitting: Nurse Practitioner

## 2022-02-24 DIAGNOSIS — R051 Acute cough: Secondary | ICD-10-CM

## 2022-02-24 DIAGNOSIS — R079 Chest pain, unspecified: Secondary | ICD-10-CM | POA: Diagnosis not present

## 2022-02-24 DIAGNOSIS — R0602 Shortness of breath: Secondary | ICD-10-CM

## 2022-02-24 DIAGNOSIS — J069 Acute upper respiratory infection, unspecified: Secondary | ICD-10-CM

## 2022-02-24 DIAGNOSIS — R059 Cough, unspecified: Secondary | ICD-10-CM | POA: Diagnosis not present

## 2022-02-28 MED ORDER — PREDNISONE 10 MG PO TABS: ORAL_TABLET | ORAL | 0 refills | Status: DC

## 2022-05-07 ENCOUNTER — Other Ambulatory Visit: Payer: Self-pay | Admitting: Internal Medicine

## 2022-05-08 MED ORDER — EPINEPHRINE 0.3 MG/0.3ML IJ SOAJ: 0.3000 mg | Freq: Every day | INTRAMUSCULAR | 1 refills | Status: AC | PRN

## 2022-07-16 IMAGING — CR DG CHEST 2V
2 series · 2 of 2 positions shown · non-contrast
Comparison: None.

CLINICAL DATA: Chest pain

EXAM:
CHEST - 2 VIEW

[chest pa]
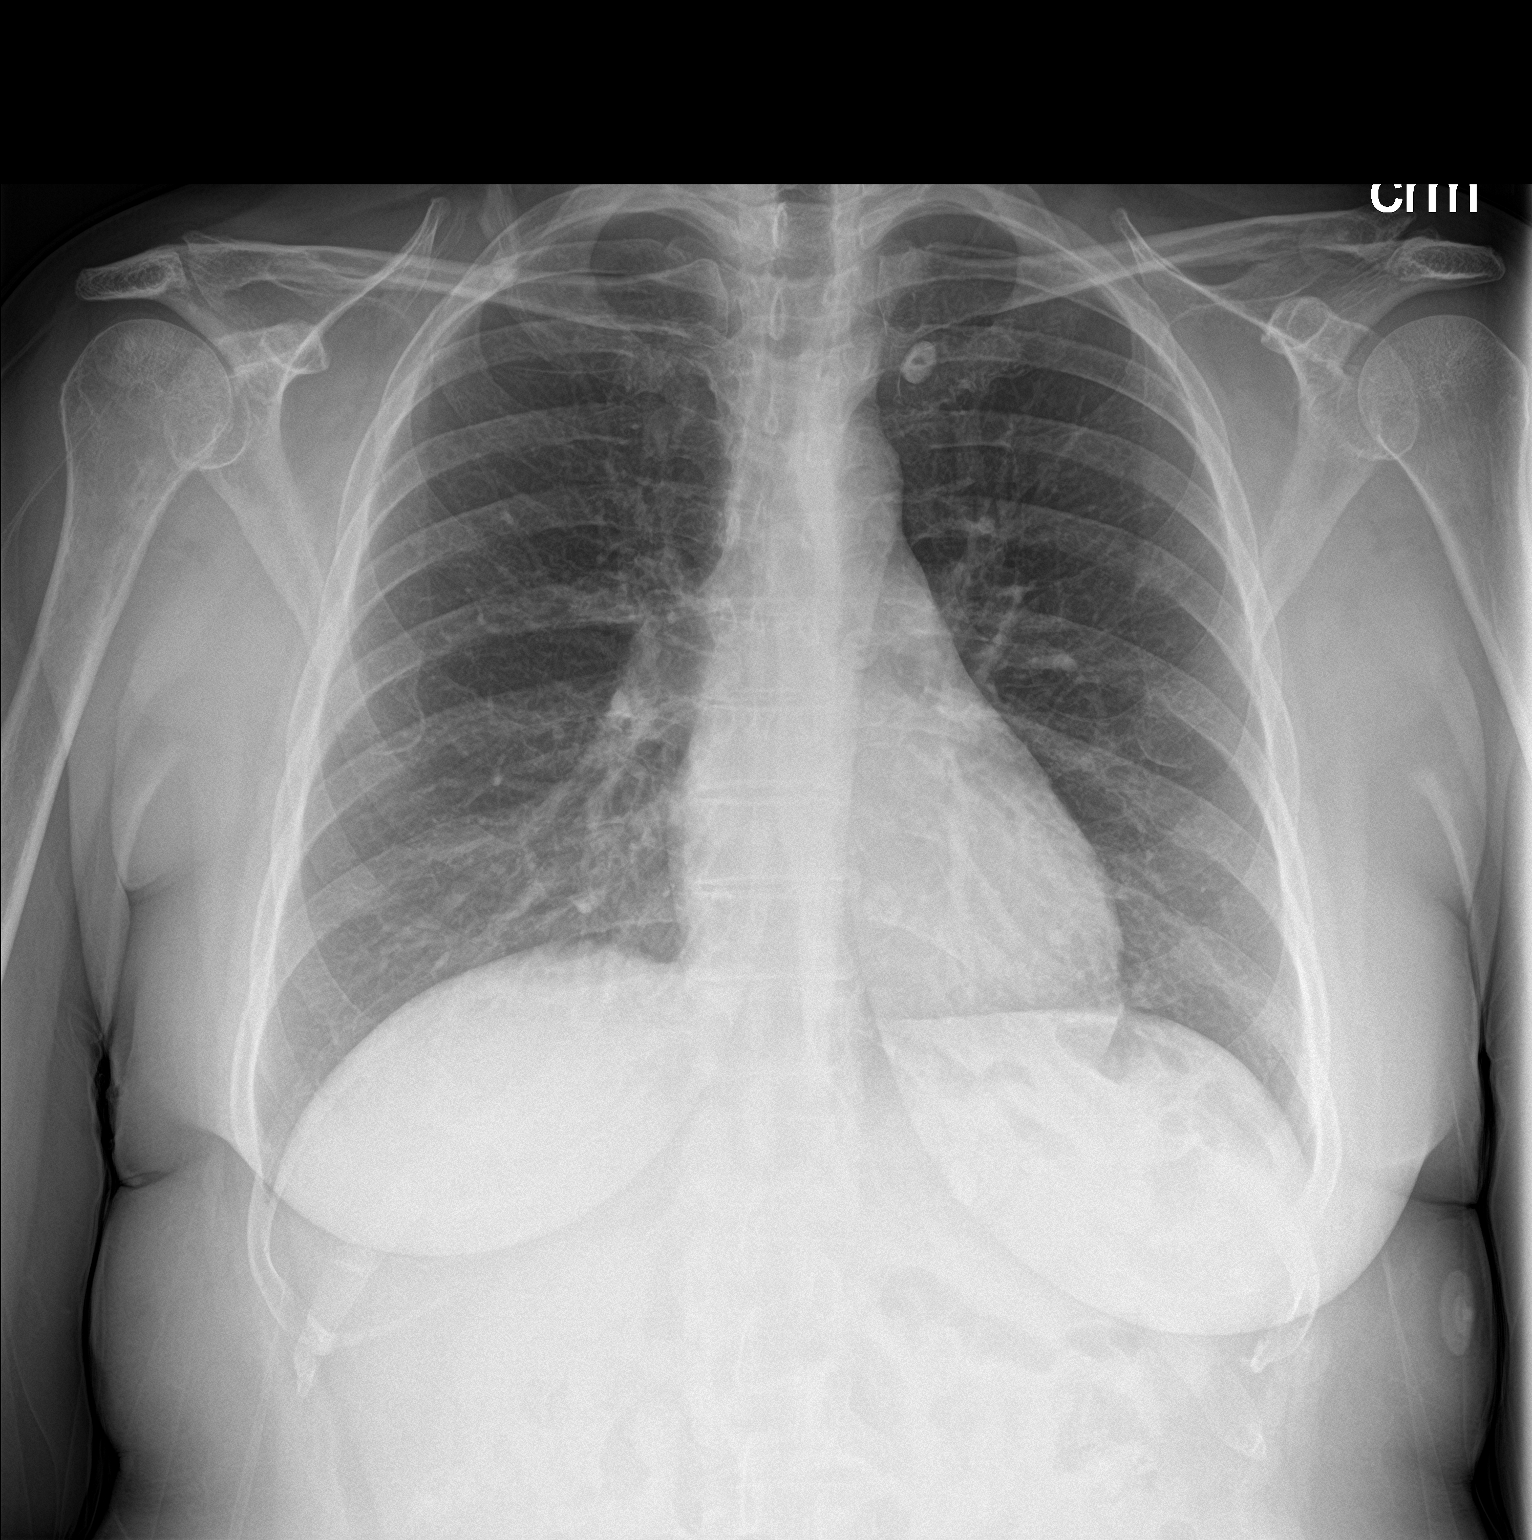

[chest lat]
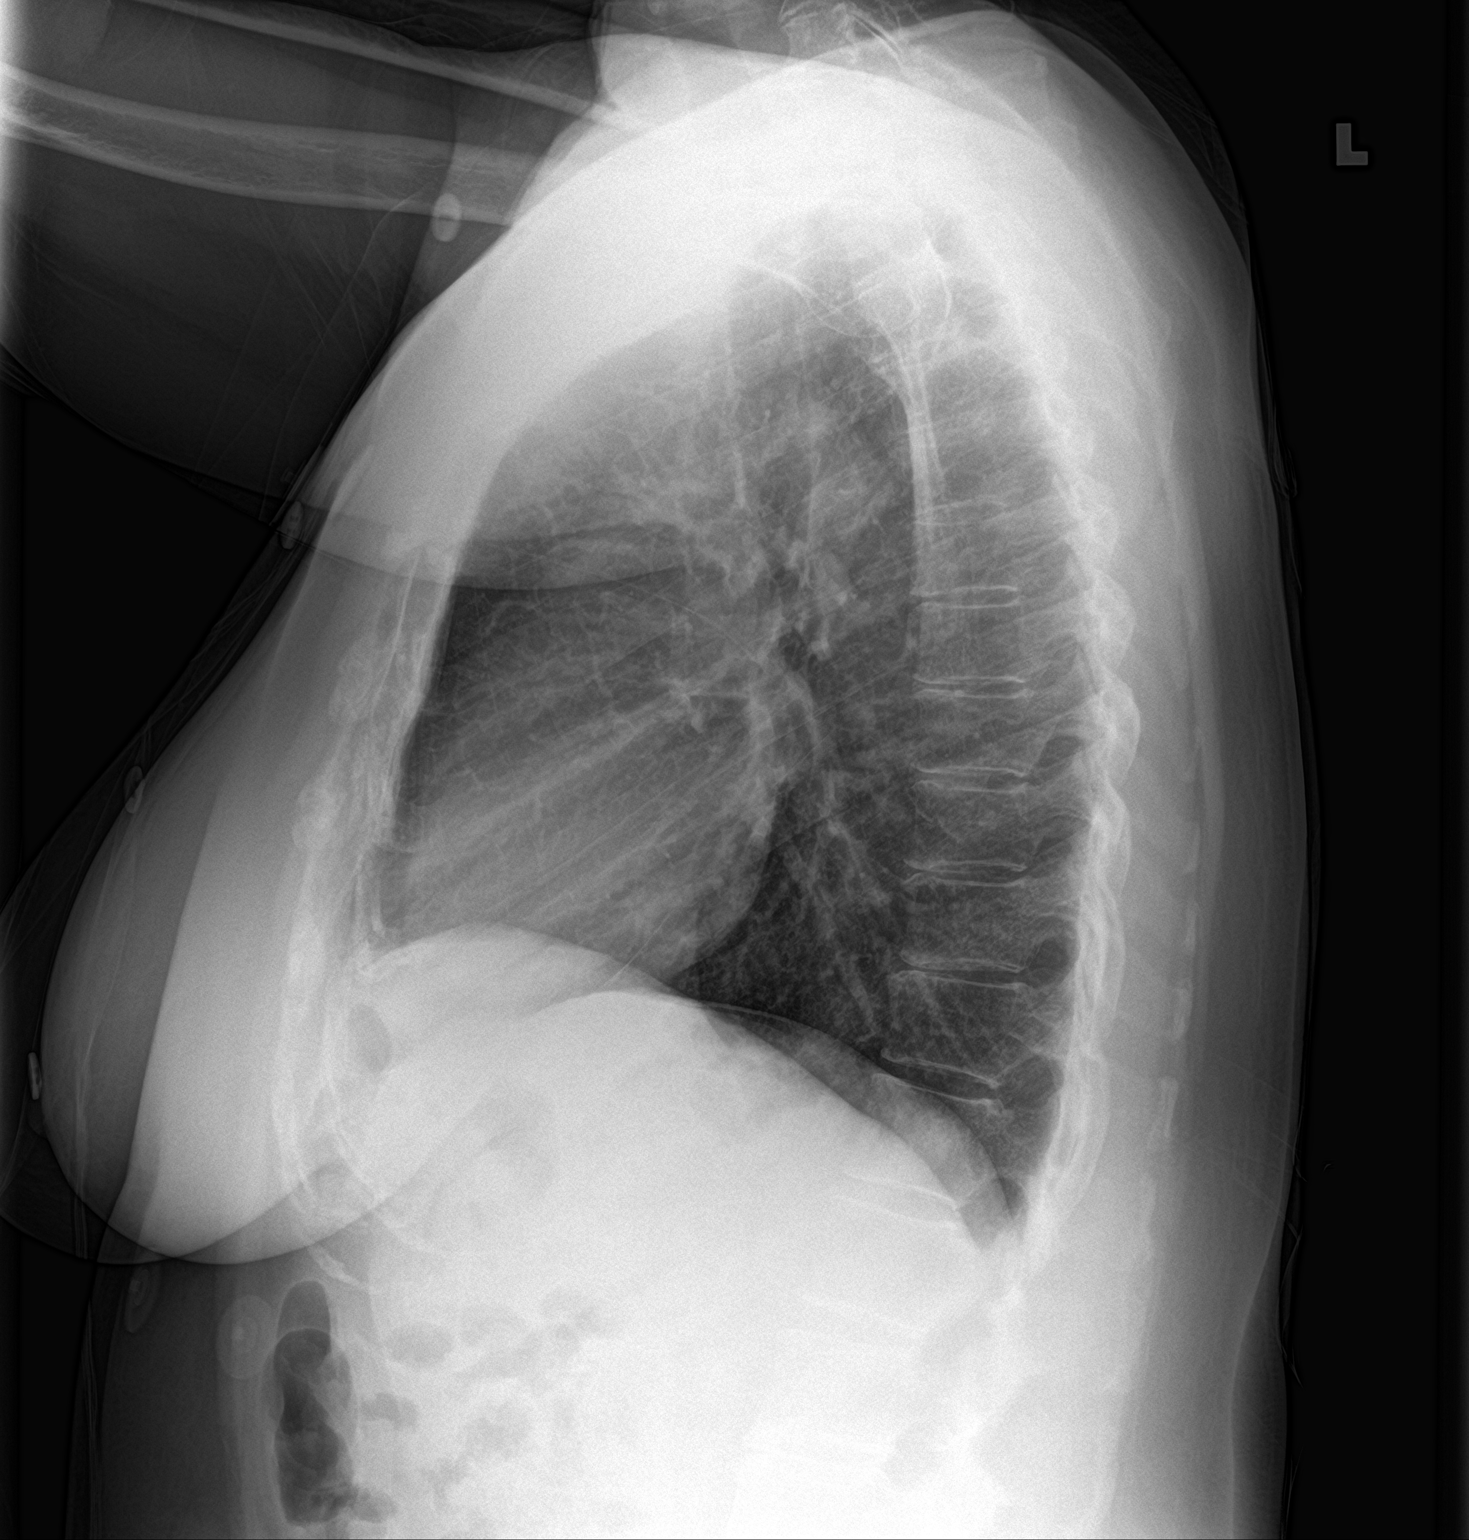

[2 of 2 positions shown; findings below may reference images not displayed]

FINDINGS: The heart size and mediastinal contours are within normal limits.
Both lungs are clear. The visualized skeletal structures are
unremarkable.
IMPRESSION: No active cardiopulmonary disease.

## 2022-09-19 ENCOUNTER — Encounter: Payer: BC Managed Care – PPO | Admitting: Nurse Practitioner

## 2022-09-29 ENCOUNTER — Ambulatory Visit (INDEPENDENT_AMBULATORY_CARE_PROVIDER_SITE_OTHER): Payer: BC Managed Care – PPO | Admitting: Nurse Practitioner

## 2022-09-29 ENCOUNTER — Encounter: Payer: Self-pay | Admitting: Nurse Practitioner

## 2022-09-29 VITALS — BP 124/70 | HR 76 | Temp 97.9°F | Resp 16 | Ht 61.0 in | Wt 166.8 lb

## 2022-09-29 DIAGNOSIS — I10 Essential (primary) hypertension: Secondary | ICD-10-CM

## 2022-09-29 DIAGNOSIS — Z130 Encounter for screening for diseases of the blood and blood-forming organs and certain disorders involving the immune mechanism: Secondary | ICD-10-CM | POA: Diagnosis not present

## 2022-09-29 DIAGNOSIS — Z13220 Encounter for screening for lipoid disorders: Secondary | ICD-10-CM | POA: Diagnosis not present

## 2022-09-29 DIAGNOSIS — E669 Obesity, unspecified: Secondary | ICD-10-CM

## 2022-09-29 DIAGNOSIS — Z136 Encounter for screening for cardiovascular disorders: Secondary | ICD-10-CM

## 2022-09-29 DIAGNOSIS — J342 Deviated nasal septum: Secondary | ICD-10-CM

## 2022-09-29 DIAGNOSIS — E559 Vitamin D deficiency, unspecified: Secondary | ICD-10-CM

## 2022-09-29 DIAGNOSIS — Z0000 Encounter for general adult medical examination without abnormal findings: Secondary | ICD-10-CM | POA: Diagnosis not present

## 2022-09-29 DIAGNOSIS — Z1389 Encounter for screening for other disorder: Secondary | ICD-10-CM

## 2022-09-29 DIAGNOSIS — E538 Deficiency of other specified B group vitamins: Secondary | ICD-10-CM

## 2022-09-29 DIAGNOSIS — Z1283 Encounter for screening for malignant neoplasm of skin: Secondary | ICD-10-CM

## 2022-09-29 DIAGNOSIS — D6851 Activated protein C resistance: Secondary | ICD-10-CM

## 2022-09-29 DIAGNOSIS — Z79899 Other long term (current) drug therapy: Secondary | ICD-10-CM | POA: Diagnosis not present

## 2022-09-29 DIAGNOSIS — Z131 Encounter for screening for diabetes mellitus: Secondary | ICD-10-CM

## 2022-09-29 DIAGNOSIS — Z1321 Encounter for screening for nutritional disorder: Secondary | ICD-10-CM

## 2022-09-29 DIAGNOSIS — E7800 Pure hypercholesterolemia, unspecified: Secondary | ICD-10-CM

## 2022-09-29 DIAGNOSIS — Z1329 Encounter for screening for other suspected endocrine disorder: Secondary | ICD-10-CM

## 2022-09-29 DIAGNOSIS — I209 Angina pectoris, unspecified: Secondary | ICD-10-CM

## 2022-09-29 DIAGNOSIS — R7303 Prediabetes: Secondary | ICD-10-CM

## 2022-09-29 DIAGNOSIS — J302 Other seasonal allergic rhinitis: Secondary | ICD-10-CM

## 2022-09-29 DIAGNOSIS — Z0001 Encounter for general adult medical examination with abnormal findings: Secondary | ICD-10-CM

## 2022-09-29 DIAGNOSIS — G8929 Other chronic pain: Secondary | ICD-10-CM

## 2022-09-29 NOTE — Patient Instructions (Signed)
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
## 2022-10-02 ENCOUNTER — Encounter: Payer: Self-pay | Admitting: Nurse Practitioner

## 2022-10-02 LAB — URINALYSIS, ROUTINE W REFLEX MICROSCOPIC
Bilirubin Urine: NEGATIVE
Glucose, UA: NEGATIVE
Hgb urine dipstick: NEGATIVE
Hyaline Cast: NONE SEEN /LPF
Ketones, ur: NEGATIVE
Leukocytes,Ua: NEGATIVE
Nitrite: NEGATIVE
RBC / HPF: NONE SEEN /HPF (ref 0–2)
Specific Gravity, Urine: 1.018 (ref 1.001–1.035)
WBC, UA: NONE SEEN /HPF (ref 0–5)
pH: 8 (ref 5.0–8.0)

## 2022-10-02 LAB — TSH: TSH: 1.42 mIU/L (ref 0.40–4.50)

## 2022-10-02 LAB — CBC WITH DIFFERENTIAL/PLATELET
Absolute Monocytes: 361 cells/uL (ref 200–950)
Basophils Absolute: 41 cells/uL (ref 0–200)
Basophils Relative: 1 %
Eosinophils Absolute: 168 cells/uL (ref 15–500)
Eosinophils Relative: 4.1 %
HCT: 39.8 % (ref 35.0–45.0)
Hemoglobin: 13.2 g/dL (ref 11.7–15.5)
Lymphs Abs: 1058 cells/uL (ref 850–3900)
MCH: 31.7 pg (ref 27.0–33.0)
MCHC: 33.2 g/dL (ref 32.0–36.0)
MCV: 95.7 fL (ref 80.0–100.0)
MPV: 10.8 fL (ref 7.5–12.5)
Monocytes Relative: 8.8 %
Neutro Abs: 2472 cells/uL (ref 1500–7800)
Neutrophils Relative %: 60.3 %
Platelets: 257 10*3/uL (ref 140–400)
RBC: 4.16 10*6/uL (ref 3.80–5.10)
RDW: 12.4 % (ref 11.0–15.0)
Total Lymphocyte: 25.8 %
WBC: 4.1 10*3/uL (ref 3.8–10.8)

## 2022-10-02 LAB — INSULIN, RANDOM: Insulin: 19.1 u[IU]/mL — ABNORMAL HIGH

## 2022-10-02 LAB — LIPID PANEL
Cholesterol: 193 mg/dL (ref <200)
HDL: 74 mg/dL (ref >=50)
LDL Cholesterol (Calc): 98 mg/dL (calc)
Non-HDL Cholesterol (Calc): 119 mg/dL (calc) (ref <130)
Total CHOL/HDL Ratio: 2.6 (calc) (ref <5.0)
Triglycerides: 115 mg/dL (ref <150)

## 2022-10-02 LAB — COMPLETE METABOLIC PANEL WITH GFR
AG Ratio: 1.8 (calc) (ref 1.0–2.5)
ALT: 21 U/L (ref 6–29)
AST: 22 U/L (ref 10–35)
Albumin: 4.4 g/dL (ref 3.6–5.1)
Alkaline phosphatase (APISO): 71 U/L (ref 37–153)
BUN: 15 mg/dL (ref 7–25)
CO2: 31 mmol/L (ref 20–32)
Calcium: 9.8 mg/dL (ref 8.6–10.4)
Chloride: 102 mmol/L (ref 98–110)
Creat: 0.64 mg/dL (ref 0.50–1.05)
Globulin: 2.4 g/dL (calc) (ref 1.9–3.7)
Glucose, Bld: 98 mg/dL (ref 65–99)
Potassium: 4.5 mmol/L (ref 3.5–5.3)
Sodium: 140 mmol/L (ref 135–146)
Total Bilirubin: 0.9 mg/dL (ref 0.2–1.2)
Total Protein: 6.8 g/dL (ref 6.1–8.1)
eGFR: 99 mL/min/{1.73_m2} (ref >=60)

## 2022-10-02 LAB — MAGNESIUM: Magnesium: 2.1 mg/dL (ref 1.5–2.5)

## 2022-10-02 LAB — HEMOGLOBIN A1C
Hgb A1c MFr Bld: 5.9 % of total Hgb — ABNORMAL HIGH (ref <5.7)
Mean Plasma Glucose: 123 mg/dL
eAG (mmol/L): 6.8 mmol/L

## 2022-10-02 LAB — VITAMIN B12: Vitamin B-12: 1399 pg/mL — ABNORMAL HIGH (ref 200–1100)

## 2022-10-02 LAB — MICROALBUMIN / CREATININE URINE RATIO
Creatinine, Urine: 96 mg/dL (ref 20–275)
Microalb Creat Ratio: 13 mg/g creat (ref <30)
Microalb, Ur: 1.2 mg/dL

## 2022-10-02 LAB — MICROSCOPIC MESSAGE

## 2022-10-02 LAB — VITAMIN D 25 HYDROXY (VIT D DEFICIENCY, FRACTURES): Vit D, 25-Hydroxy: 34 ng/mL (ref 30–100)

## 2022-11-10 ENCOUNTER — Other Ambulatory Visit: Payer: Self-pay | Admitting: Nurse Practitioner

## 2022-11-21 DIAGNOSIS — Z1231 Encounter for screening mammogram for malignant neoplasm of breast: Secondary | ICD-10-CM | POA: Diagnosis not present

## 2022-11-21 DIAGNOSIS — Z780 Asymptomatic menopausal state: Secondary | ICD-10-CM | POA: Diagnosis not present

## 2022-11-30 DIAGNOSIS — H26492 Other secondary cataract, left eye: Secondary | ICD-10-CM | POA: Diagnosis not present

## 2022-11-30 DIAGNOSIS — Z961 Presence of intraocular lens: Secondary | ICD-10-CM | POA: Diagnosis not present

## 2022-11-30 DIAGNOSIS — H5203 Hypermetropia, bilateral: Secondary | ICD-10-CM | POA: Diagnosis not present

## 2022-11-30 DIAGNOSIS — H31002 Unspecified chorioretinal scars, left eye: Secondary | ICD-10-CM | POA: Diagnosis not present

## 2022-11-30 DIAGNOSIS — H353132 Nonexudative age-related macular degeneration, bilateral, intermediate dry stage: Secondary | ICD-10-CM | POA: Diagnosis not present

## 2022-12-18 DIAGNOSIS — Z01419 Encounter for gynecological examination (general) (routine) without abnormal findings: Secondary | ICD-10-CM | POA: Diagnosis not present

## 2022-12-18 DIAGNOSIS — Z780 Asymptomatic menopausal state: Secondary | ICD-10-CM | POA: Diagnosis not present

## 2022-12-29 ENCOUNTER — Encounter: Payer: Self-pay | Admitting: Nurse Practitioner

## 2022-12-29 ENCOUNTER — Ambulatory Visit: Payer: BC Managed Care – PPO | Admitting: Nurse Practitioner

## 2022-12-29 VITALS — BP 126/72 | HR 70 | Temp 97.6°F | Ht 61.0 in | Wt 172.0 lb

## 2022-12-29 DIAGNOSIS — R7303 Prediabetes: Secondary | ICD-10-CM | POA: Diagnosis not present

## 2022-12-29 DIAGNOSIS — I209 Angina pectoris, unspecified: Secondary | ICD-10-CM

## 2022-12-29 DIAGNOSIS — Z79899 Other long term (current) drug therapy: Secondary | ICD-10-CM

## 2022-12-29 DIAGNOSIS — E669 Obesity, unspecified: Secondary | ICD-10-CM

## 2022-12-29 DIAGNOSIS — J342 Deviated nasal septum: Secondary | ICD-10-CM

## 2022-12-29 DIAGNOSIS — D6851 Activated protein C resistance: Secondary | ICD-10-CM | POA: Diagnosis not present

## 2022-12-29 DIAGNOSIS — D519 Vitamin B12 deficiency anemia, unspecified: Secondary | ICD-10-CM | POA: Diagnosis not present

## 2022-12-29 DIAGNOSIS — G8929 Other chronic pain: Secondary | ICD-10-CM

## 2022-12-29 DIAGNOSIS — M25561 Pain in right knee: Secondary | ICD-10-CM | POA: Diagnosis not present

## 2022-12-29 DIAGNOSIS — J302 Other seasonal allergic rhinitis: Secondary | ICD-10-CM

## 2022-12-29 DIAGNOSIS — E7800 Pure hypercholesterolemia, unspecified: Secondary | ICD-10-CM

## 2022-12-29 DIAGNOSIS — R7989 Other specified abnormal findings of blood chemistry: Secondary | ICD-10-CM

## 2022-12-29 NOTE — Patient Instructions (Signed)
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
## 2022-12-31 ENCOUNTER — Encounter: Payer: Self-pay | Admitting: Nurse Practitioner

## 2023-01-01 LAB — INSULIN, RANDOM: Insulin: 7.1 u[IU]/mL

## 2023-01-01 LAB — COMPLETE METABOLIC PANEL WITH GFR
AG Ratio: 1.6 (calc) (ref 1.0–2.5)
ALT: 29 U/L (ref 6–29)
AST: 26 U/L (ref 10–35)
Albumin: 4.4 g/dL (ref 3.6–5.1)
Alkaline phosphatase (APISO): 88 U/L (ref 37–153)
BUN: 17 mg/dL (ref 7–25)
CO2: 29 mmol/L (ref 20–32)
Calcium: 9.8 mg/dL (ref 8.6–10.4)
Chloride: 101 mmol/L (ref 98–110)
Creat: 0.59 mg/dL (ref 0.50–1.05)
Globulin: 2.7 g/dL (ref 1.9–3.7)
Glucose, Bld: 92 mg/dL (ref 65–99)
Potassium: 4.3 mmol/L (ref 3.5–5.3)
Sodium: 138 mmol/L (ref 135–146)
Total Bilirubin: 1.2 mg/dL (ref 0.2–1.2)
Total Protein: 7.1 g/dL (ref 6.1–8.1)
eGFR: 101 mL/min/{1.73_m2} (ref >=60)

## 2023-01-01 LAB — LIPID PANEL
Cholesterol: 174 mg/dL (ref <200)
HDL: 78 mg/dL (ref >=50)
LDL Cholesterol (Calc): 77 mg/dL
Non-HDL Cholesterol (Calc): 96 mg/dL (ref <130)
Total CHOL/HDL Ratio: 2.2 (calc) (ref <5.0)
Triglycerides: 104 mg/dL (ref <150)

## 2023-01-01 LAB — CBC WITH DIFFERENTIAL/PLATELET
Absolute Lymphocytes: 1622 {cells}/uL (ref 850–3900)
Absolute Monocytes: 476 {cells}/uL (ref 200–950)
Basophils Absolute: 39 {cells}/uL (ref 0–200)
Basophils Relative: 0.5 %
Eosinophils Absolute: 70 {cells}/uL (ref 15–500)
Eosinophils Relative: 0.9 %
HCT: 38.9 % (ref 35.0–45.0)
Hemoglobin: 12.8 g/dL (ref 11.7–15.5)
MCH: 31.3 pg (ref 27.0–33.0)
MCHC: 32.9 g/dL (ref 32.0–36.0)
MCV: 95.1 fL (ref 80.0–100.0)
MPV: 11 fL (ref 7.5–12.5)
Monocytes Relative: 6.1 %
Neutro Abs: 5593 {cells}/uL (ref 1500–7800)
Neutrophils Relative %: 71.7 %
Platelets: 276 10*3/uL (ref 140–400)
RBC: 4.09 10*6/uL (ref 3.80–5.10)
RDW: 12.1 % (ref 11.0–15.0)
Total Lymphocyte: 20.8 %
WBC: 7.8 10*3/uL (ref 3.8–10.8)

## 2023-01-01 LAB — VITAMIN B12: Vitamin B-12: 1274 pg/mL — ABNORMAL HIGH (ref 200–1100)

## 2023-01-01 LAB — HEMOGLOBIN A1C
Hgb A1c MFr Bld: 5.7 %{Hb} — ABNORMAL HIGH (ref <5.7)
Mean Plasma Glucose: 117 mg/dL
eAG (mmol/L): 6.5 mmol/L

## 2023-01-08 DIAGNOSIS — H81399 Other peripheral vertigo, unspecified ear: Secondary | ICD-10-CM | POA: Diagnosis not present

## 2023-01-08 DIAGNOSIS — R110 Nausea: Secondary | ICD-10-CM | POA: Diagnosis not present

## 2023-01-19 ENCOUNTER — Encounter: Payer: Self-pay | Admitting: Nurse Practitioner

## 2023-01-19 ENCOUNTER — Ambulatory Visit: Payer: Medicare PPO | Admitting: Nurse Practitioner

## 2023-01-19 VITALS — BP 136/68 | HR 64 | Temp 97.7°F | Ht 61.0 in | Wt 172.8 lb

## 2023-01-19 DIAGNOSIS — J0100 Acute maxillary sinusitis, unspecified: Secondary | ICD-10-CM | POA: Diagnosis not present

## 2023-01-19 DIAGNOSIS — G43809 Other migraine, not intractable, without status migrainosus: Secondary | ICD-10-CM

## 2023-01-19 MED ORDER — DEXAMETHASONE 4 MG PO TABS: ORAL_TABLET | ORAL | 0 refills | Status: AC

## 2023-01-19 MED ORDER — AZITHROMYCIN 250 MG PO TABS: ORAL_TABLET | ORAL | 1 refills | Status: AC

## 2023-01-19 NOTE — Progress Notes (Signed)
 Assessment and Plan:  Vanessa Hartman was seen today for an episodic visit.  Diagnoses and all order for this visit:  1. Vestibular migraine (Primary) Resolving Continue Ondansetron  on hand for tmt of nausea Refer to ENT for further review evaluation and treatment  - Ambulatory referral to ENT  2. Acute non-recurrent maxillary sinusitis Start Z-pak to aide in sinusitis and reduce vestibular migraine symptoms Start Decadron  as directed to help reduce inflammation. Continue Zyrtec Stay well hydrated to keep mucus thin and productive   - azithromycin  (ZITHROMAX ) 250 MG tablet; Take 2 tablets on  Day 1,  followed by 1 tablet  daily for 4 more days    for Sinusitis  /Bronchitis  Dispense: 6 each; Refill: 1 - dexamethasone  (DECADRON ) 4 MG tablet; Take 1 tab 3 x /day for 2 days, then 2 x /day for 2  Days,  then 1 tab daily  Dispense: 13 tablet; Refill: 0 - Ambulatory referral to ENT   Notify office for further evaluation and treatment, questions or concerns if s/s fail to improve. The risks and benefits of my recommendations, as well as other treatment options were discussed with the patient today. Questions were answered.  Further disposition pending results of labs. Discussed med's effects and SE's.    Over 20 minutes of exam, counseling, chart review, and critical decision making was performed.   Future Appointments  Date Time Provider Department Center  04/06/2023 11:30 AM Laurice President, NP [facility] None  11/12/2023 10:00 AM Narayan Scull, President, NP [facility] None    ------------------------------------------------------------------------------------------------------------------   HPI BP 136/68   Pulse 64   Temp 97.7 F (36.5 C)   Ht 5' 1 (1.549 m)   Wt 172 lb 12.8 oz (78.4 kg)   SpO2 99%   BMI 32.65 kg/m    63 y.o.female presents for evaluation of dizziness accompanied by migraine headache and N/V.  She reports a hx of vestibular migraine however, she has not  experienced an episode in over 10 years.  Onset was approximately 3 weeks ago.  [place] felt may have triggered the symptoms.  She was seen and treated at local ER and symptoms are subsiding.  She continues to feel as though her sinuses are clogged on the left side of her face.  Left ear feels full.  Continues to have mild intermittent nausea.    Reports a history of vertigo, balance issues. 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 Reports this was diagnosed in her 75's. The problem mostly resolved after she turned 38. She will have an occasional episode of dizziness and HA. 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   Approximately 4-5 years ago had a episode of falling at work while standing in front of her desk. She blacked out. She reports this was d/t a vasovagal incident.   Past Medical History:  Diagnosis Date   Allergy    Arthritis    Endometriosis    Migraine      Allergies  Allergen Reactions   Bee Venom Anaphylaxis   Other Nausea And Vomiting    Pt states she has sever sensitivity to all opioids    Current Outpatient Medications on File Prior to Visit  Medication Sig   Alpha-D-Galactosidase (ECK FOOD ENZYME PO) Take 1 capsule by mouth See admin instructions. 30 minutes before supper   atorvastatin  (LIPITOR) 40  MG tablet TAKE 1 TABLET(40 MG) BY MOUTH AT BEDTIME   benzonatate  (TESSALON  PERLES) 100 MG capsule Take 1 capsule (100 mg total) by mouth 3 (three) times daily as needed for cough.   cetirizine (ZYRTEC) 10 MG tablet Take 10 mg by mouth daily.   Chlorpheniramine-Acetaminophen (CORICIDIN HBP COLD/FLU PO) Take by mouth.   Coenzyme Q10 (COQ10 PO) Take by mouth.   CRANBERRY PO Take by mouth daily.   Cyanocobalamin  (B-12) 500 MCG TABS Take by mouth.   EPINEPHrine  0.3 mg/0.3 mL IJ SOAJ injection Inject 0.3 mg into  the muscle daily as needed.   estradiol (VIVELLE-DOT) 0.05 MG/24HR patch Place 1 patch onto the skin 2 (two) times a week.   fexofenadine (ALLEGRA) 60 MG tablet Take 60 mg by mouth daily.   glucosamine-chondroitin 500-400 MG tablet Take 1 tablet by mouth at bedtime.   Homeopathic Products (ZICAM COLD REMEDY PO) Take by mouth.   Lactobacillus-Inulin (CULTURELLE DIGESTIVE DAILY PO) Take 1 capsule by mouth daily.   LUTEIN PO Take 1 tablet by mouth at bedtime.   Magnesium 200 MG TABS Take 400 mg by mouth at bedtime.   MEGARED OMEGA-3 KRILL OIL PO Take 1 capsule by mouth at bedtime.   Multiple Vitamins-Minerals (CENTRUM SILVER ADULT 50+ PO) Take 1 tablet by mouth daily.   progesterone (PROMETRIUM) 100 MG capsule Take 100 mg by mouth at bedtime.   TURMERIC PO Take 1 tablet by mouth at bedtime.   vitamin C (ASCORBIC ACID) 500 MG tablet Take 500 mg by mouth daily.   VITAMIN D  PO Take by mouth. 4,000 units   zinc gluconate 50 MG tablet Take 50 mg by mouth daily.   acetaminophen (TYLENOL) 325 MG tablet Take 650 mg by mouth every 6 (six) hours as needed for moderate pain or headache. (Patient not taking: Reported on 01/19/2023)   No current facility-administered medications on file prior to visit.    ROS: all negative except what is noted in the HPI.   Physical Exam:  BP 136/68   Pulse 64   Temp 97.7 F (36.5 C)   Ht 5' 1 (1.549 m)   Wt 172 lb 12.8 oz (78.4 kg)   SpO2 99%   BMI 32.65 kg/m   General Appearance: NAD.  Awake, conversant and cooperative. Eyes: PERRLA, EOMs intact.  Sclera white.  Conjunctiva without erythema. Sinuses: Left frontal/maxillary tenderness.  No nasal discharge. Nares patent.  ENT/Mouth: Ext aud canals clear.  Bilateral TMs w/DOL and without erythema or bulging. Hearing intact.  Posterior pharynx without swelling or exudate.  Tonsils without swelling or erythema.  Neck: Supple.  No masses, nodules or thyromegaly. Respiratory: Effort is regular with non-labored  breathing. Breath sounds are equal bilaterally without rales, rhonchi, wheezing or stridor.  Cardio: RRR with no MRGs. Brisk peripheral pulses without edema.  Abdomen: Active BS in all four quadrants.  Soft and non-tender without guarding, rebound tenderness, hernias or masses. Lymphatics: Non tender without lymphadenopathy.  Musculoskeletal: Full ROM, 5/5 strength, normal ambulation.  No clubbing or cyanosis. Skin: Appropriate color for ethnicity. Warm without rashes, lesions, ecchymosis, ulcers.  Neuro: CN II-XII grossly normal. Normal muscle tone without cerebellar symptoms and intact sensation.   Psych: AO X 3,  appropriate mood and affect, insight and judgment.     BASCOM NECESSARY, NP 10:04 PM [facility]

## 2023-01-21 NOTE — Patient Instructions (Signed)
Vertigo Vertigo is the feeling that you or the things around you are moving when they are not. This feeling can come and go at any time. Vertigo often goes away on its own. This condition can be dangerous if it happens when you are doing activities like driving or working with machines. Your doctor will do tests to find the cause of your vertigo. These tests will also help your doctor decide on the best treatment for you. Follow these instructions at home: Eating and drinking     Drink enough fluid to keep your pee (urine) pale yellow. Do not drink alcohol. Activity Return to your normal activities when your doctor says that it is safe. In the morning, first sit up on the side of the bed. When you feel okay, stand slowly while you hold onto something until you know that your balance is fine. Move slowly. Avoid sudden body or head movements or certain positions, as told by your doctor. Use a cane if you have trouble standing or walking. Sit down right away if you feel dizzy. Avoid doing any tasks or activities that can cause danger to you or others if you get dizzy. Avoid bending down if you feel dizzy. Place items in your home so that they are easy for you to reach without bending or leaning over. Do not drive or use machinery if you feel dizzy. General instructions Take over-the-counter and prescription medicines only as told by your doctor. Keep all follow-up visits. Contact a doctor if: Your medicine does not help your vertigo. Your problems get worse or you have new symptoms. You have a fever. You feel like you may vomit (nauseous), or this feeling gets worse. You start to vomit. Your family or friends see changes in how you act. You lose feeling (have numbness) in part of your body. You feel prickling and tingling in a part of your body. Get help right away if: You are always dizzy. You faint. You get very bad headaches. You get a stiff neck. Bright light starts to bother  you. You have trouble moving or talking. You feel weak in your hands, arms, or legs. You have changes in your hearing or in how you see (vision). These symptoms may be an emergency. Get help right away. Call your local emergency services (911 in the [place] Do not wait to see if the symptoms will go away. Do not drive yourself to the hospital. Summary Vertigo is the feeling that you or the things around you are moving when they are not. Your doctor will do tests to find the cause of your vertigo. You may be told to avoid some tasks, positions, or movements. Contact a doctor if your medicine is not helping, or if you have a fever, new symptoms, or a change in how you act. Get help right away if you get very bad headaches, or if you have changes in how you speak, hear, or see. This information is not intended to replace advice given to you by your health care provider. Make sure you discuss any questions you have with your health care provider. Document Revised: 11/26/2019 Document Reviewed: 11/26/2019 Elsevier Patient Education  2024 [employer]  

## 2023-04-06 ENCOUNTER — Ambulatory Visit: Payer: Medicare PPO | Admitting: Nurse Practitioner

## 2023-06-29 DIAGNOSIS — R413 Other amnesia: Secondary | ICD-10-CM | POA: Diagnosis not present

## 2023-06-29 DIAGNOSIS — L309 Dermatitis, unspecified: Secondary | ICD-10-CM | POA: Diagnosis not present

## 2023-06-29 DIAGNOSIS — H938X1 Other specified disorders of right ear: Secondary | ICD-10-CM | POA: Diagnosis not present

## 2023-07-04 DIAGNOSIS — M722 Plantar fascial fibromatosis: Secondary | ICD-10-CM | POA: Diagnosis not present

## 2023-07-05 DIAGNOSIS — R42 Dizziness and giddiness: Secondary | ICD-10-CM | POA: Diagnosis not present

## 2023-07-09 DIAGNOSIS — L237 Allergic contact dermatitis due to plants, except food: Secondary | ICD-10-CM | POA: Diagnosis not present

## 2023-07-09 DIAGNOSIS — H6593 Unspecified nonsuppurative otitis media, bilateral: Secondary | ICD-10-CM | POA: Diagnosis not present

## 2023-07-30 DIAGNOSIS — M25561 Pain in right knee: Secondary | ICD-10-CM | POA: Diagnosis not present

## 2023-07-31 DIAGNOSIS — M25561 Pain in right knee: Secondary | ICD-10-CM | POA: Diagnosis not present

## 2023-08-20 DIAGNOSIS — M25561 Pain in right knee: Secondary | ICD-10-CM | POA: Diagnosis not present

## 2023-08-20 DIAGNOSIS — M722 Plantar fascial fibromatosis: Secondary | ICD-10-CM | POA: Diagnosis not present

## 2023-10-01 ENCOUNTER — Encounter: Payer: Medicare PPO | Admitting: Nurse Practitioner

## 2023-11-12 ENCOUNTER — Encounter: Payer: Medicare PPO | Admitting: Nurse Practitioner

## 2023-11-14 DIAGNOSIS — M722 Plantar fascial fibromatosis: Secondary | ICD-10-CM | POA: Diagnosis not present

## 2023-11-14 DIAGNOSIS — M25551 Pain in right hip: Secondary | ICD-10-CM | POA: Diagnosis not present

## 2023-11-14 DIAGNOSIS — M25561 Pain in right knee: Secondary | ICD-10-CM | POA: Diagnosis not present

## 2023-11-16 DIAGNOSIS — M25561 Pain in right knee: Secondary | ICD-10-CM | POA: Diagnosis not present

## 2023-11-19 DIAGNOSIS — R7303 Prediabetes: Secondary | ICD-10-CM | POA: Diagnosis not present

## 2023-11-19 DIAGNOSIS — B001 Herpesviral vesicular dermatitis: Secondary | ICD-10-CM | POA: Diagnosis not present

## 2023-11-19 DIAGNOSIS — Z1211 Encounter for screening for malignant neoplasm of colon: Secondary | ICD-10-CM | POA: Diagnosis not present

## 2023-11-19 DIAGNOSIS — E7800 Pure hypercholesterolemia, unspecified: Secondary | ICD-10-CM | POA: Diagnosis not present

## 2023-11-19 DIAGNOSIS — Z789 Other specified health status: Secondary | ICD-10-CM | POA: Diagnosis not present

## 2023-11-19 DIAGNOSIS — Z0000 Encounter for general adult medical examination without abnormal findings: Secondary | ICD-10-CM | POA: Diagnosis not present

## 2023-11-20 DIAGNOSIS — Z789 Other specified health status: Secondary | ICD-10-CM | POA: Diagnosis not present

## 2023-11-20 DIAGNOSIS — R7303 Prediabetes: Secondary | ICD-10-CM | POA: Diagnosis not present

## 2023-11-20 DIAGNOSIS — E7800 Pure hypercholesterolemia, unspecified: Secondary | ICD-10-CM | POA: Diagnosis not present

## 2023-11-21 DIAGNOSIS — M25561 Pain in right knee: Secondary | ICD-10-CM | POA: Diagnosis not present

## 2023-11-23 DIAGNOSIS — Z1231 Encounter for screening mammogram for malignant neoplasm of breast: Secondary | ICD-10-CM | POA: Diagnosis not present

## 2023-11-23 DIAGNOSIS — R92323 Mammographic fibroglandular density, bilateral breasts: Secondary | ICD-10-CM | POA: Diagnosis not present

## 2023-11-23 DIAGNOSIS — Z1211 Encounter for screening for malignant neoplasm of colon: Secondary | ICD-10-CM | POA: Diagnosis not present

## 2023-11-28 DIAGNOSIS — M25561 Pain in right knee: Secondary | ICD-10-CM | POA: Diagnosis not present

## 2023-12-12 DIAGNOSIS — H31002 Unspecified chorioretinal scars, left eye: Secondary | ICD-10-CM | POA: Diagnosis not present

## 2023-12-12 DIAGNOSIS — H5211 Myopia, right eye: Secondary | ICD-10-CM | POA: Diagnosis not present

## 2023-12-12 DIAGNOSIS — H353132 Nonexudative age-related macular degeneration, bilateral, intermediate dry stage: Secondary | ICD-10-CM | POA: Diagnosis not present

## 2023-12-12 DIAGNOSIS — Z961 Presence of intraocular lens: Secondary | ICD-10-CM | POA: Diagnosis not present

## 2023-12-12 DIAGNOSIS — H01004 Unspecified blepharitis left upper eyelid: Secondary | ICD-10-CM | POA: Diagnosis not present

## 2023-12-12 DIAGNOSIS — H524 Presbyopia: Secondary | ICD-10-CM | POA: Diagnosis not present

## 2023-12-12 DIAGNOSIS — H26492 Other secondary cataract, left eye: Secondary | ICD-10-CM | POA: Diagnosis not present

## 2023-12-12 DIAGNOSIS — H01001 Unspecified blepharitis right upper eyelid: Secondary | ICD-10-CM | POA: Diagnosis not present

## 2024-03-19 ENCOUNTER — Ambulatory Visit: Payer: Self-pay | Admitting: Allergy
